# Patient Record
Sex: Male | Born: 1935 | Race: White | Hispanic: No | Marital: Single | Smoking: Former smoker
Health system: Southern US, Community
[De-identification: ages and names within clinical notes are randomized; demographics above are authoritative.]

## PROBLEM LIST (undated history)

## (undated) DIAGNOSIS — F32A Depression, unspecified: Secondary | ICD-10-CM

## (undated) DIAGNOSIS — F329 Major depressive disorder, single episode, unspecified: Secondary | ICD-10-CM

## (undated) DIAGNOSIS — J45909 Unspecified asthma, uncomplicated: Secondary | ICD-10-CM

## (undated) DIAGNOSIS — F319 Bipolar disorder, unspecified: Secondary | ICD-10-CM

## (undated) DIAGNOSIS — F0390 Unspecified dementia without behavioral disturbance: Secondary | ICD-10-CM

## (undated) DIAGNOSIS — F209 Schizophrenia, unspecified: Secondary | ICD-10-CM

## (undated) DIAGNOSIS — E785 Hyperlipidemia, unspecified: Secondary | ICD-10-CM

## (undated) DIAGNOSIS — J449 Chronic obstructive pulmonary disease, unspecified: Secondary | ICD-10-CM

## (undated) DIAGNOSIS — F410 Panic disorder [episodic paroxysmal anxiety] without agoraphobia: Secondary | ICD-10-CM

## (undated) DIAGNOSIS — K589 Irritable bowel syndrome without diarrhea: Secondary | ICD-10-CM

## (undated) DIAGNOSIS — C3490 Malignant neoplasm of unspecified part of unspecified bronchus or lung: Secondary | ICD-10-CM

## (undated) HISTORY — PX: APPENDECTOMY: SHX54

---

## 2007-02-09 ENCOUNTER — Inpatient Hospital Stay: Payer: Self-pay | Admitting: Psychiatry

## 2007-06-11 ENCOUNTER — Emergency Department: Payer: Self-pay | Admitting: Emergency Medicine

## 2007-06-11 ENCOUNTER — Other Ambulatory Visit: Payer: Self-pay

## 2007-08-03 ENCOUNTER — Other Ambulatory Visit: Payer: Self-pay

## 2007-08-03 ENCOUNTER — Inpatient Hospital Stay: Payer: Self-pay | Admitting: Internal Medicine

## 2007-08-04 ENCOUNTER — Other Ambulatory Visit: Payer: Self-pay

## 2007-08-27 ENCOUNTER — Ambulatory Visit: Payer: Self-pay | Admitting: General Surgery

## 2007-09-02 ENCOUNTER — Inpatient Hospital Stay: Payer: Self-pay | Admitting: General Surgery

## 2009-07-28 IMAGING — CT CT GUIDED ABCESS DRAINAGE WITH CATHETER
1 of 3 series · 15 of 32 positions shown, 20 images · non-contrast
Comparison: none

REASON FOR EXAM: percutaneous cholecystostomy
COMMENTS:

PROCEDURE:     CT  - CT GUIDED ABSCESS DRAINAGE  - August 06, 2007 [DATE]
RESULT:
HISTORY: Cholecystitis.

[Series 2: soft tissue · axial · 0.92mm/px · z∈[-1279,-1015]mm · 15 of 37 slices shown, 20 images]
[im 2/37  soft-tissue]
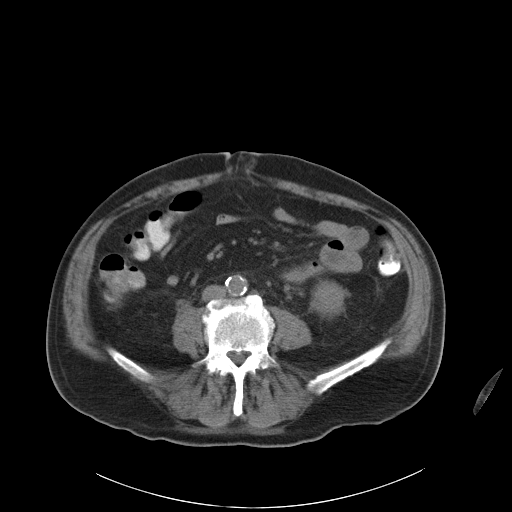
[im 2/37  bone]
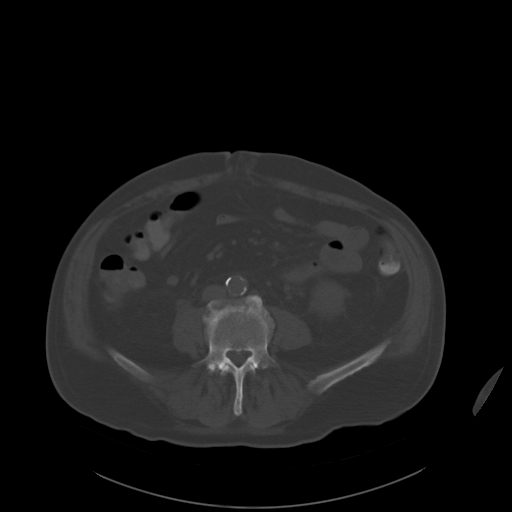
[im 4/37  soft-tissue]
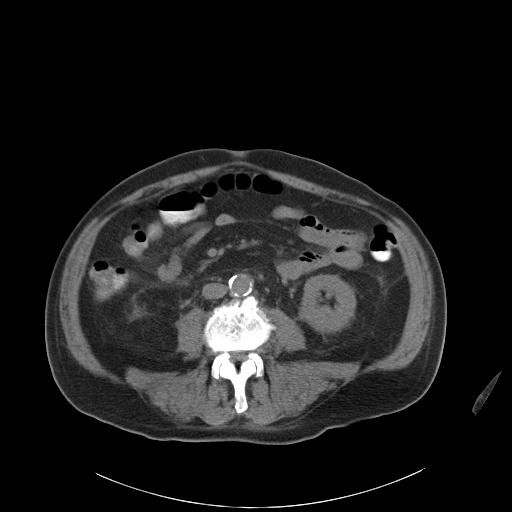
[im 8/37  soft-tissue]
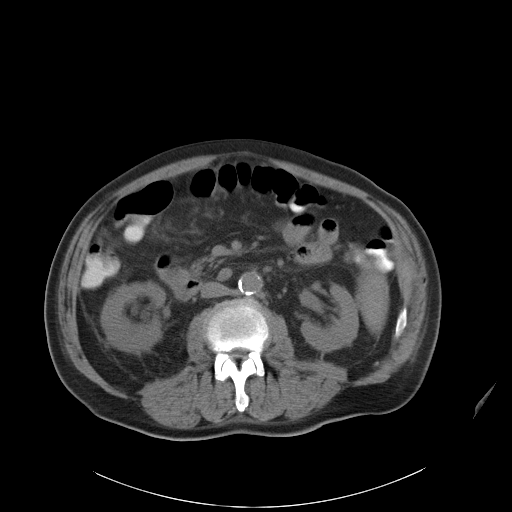
[im 10/37  soft-tissue]
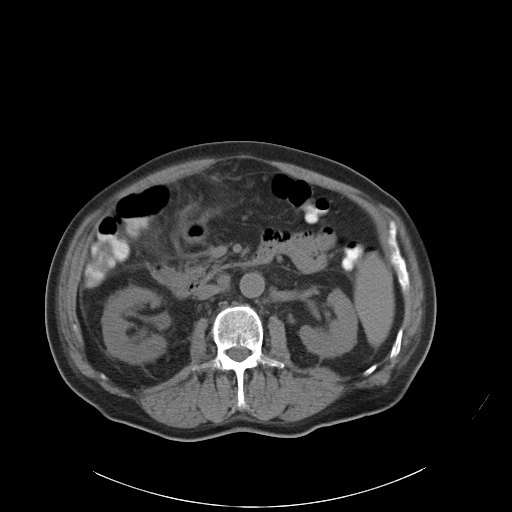
[im 12/37  soft-tissue]
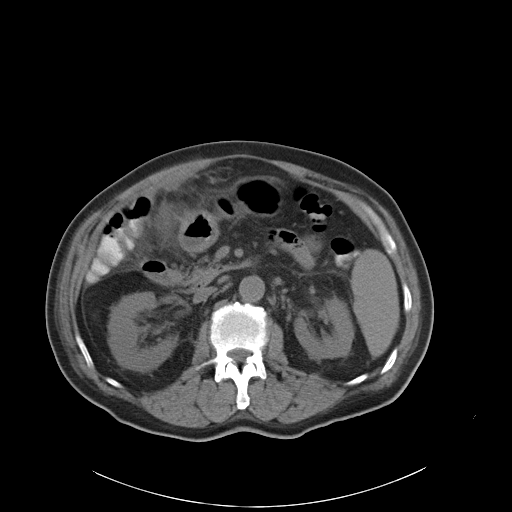
[im 16/37  soft-tissue]
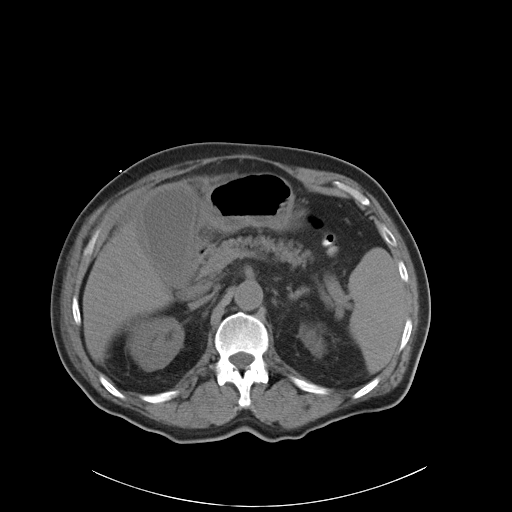
[im 18/37  soft-tissue]
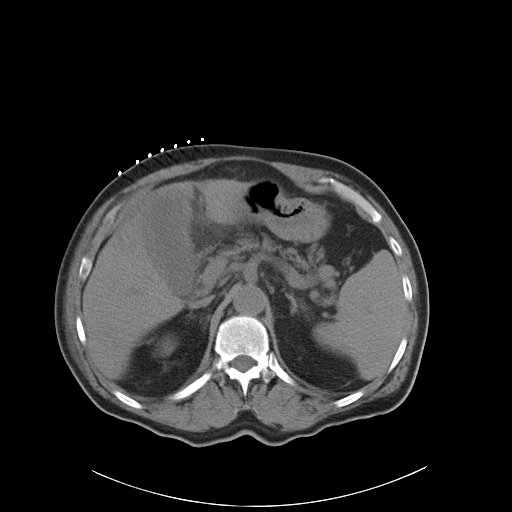
[im 19/37  soft-tissue]
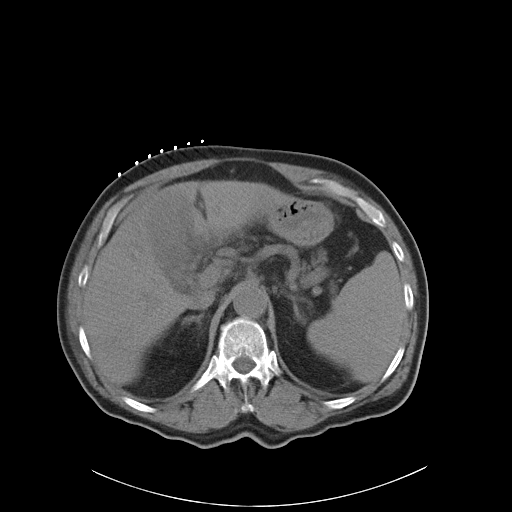
[im 21/37  soft-tissue]
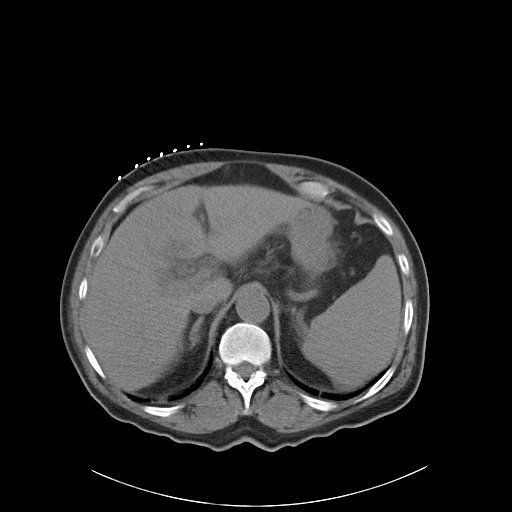
[im 21/37  bone]
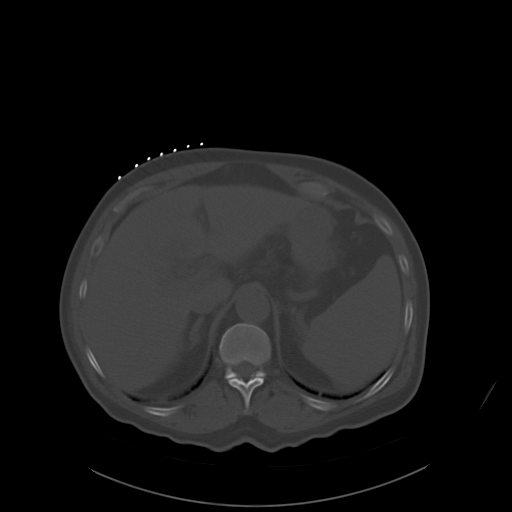
[im 25/37  soft-tissue]
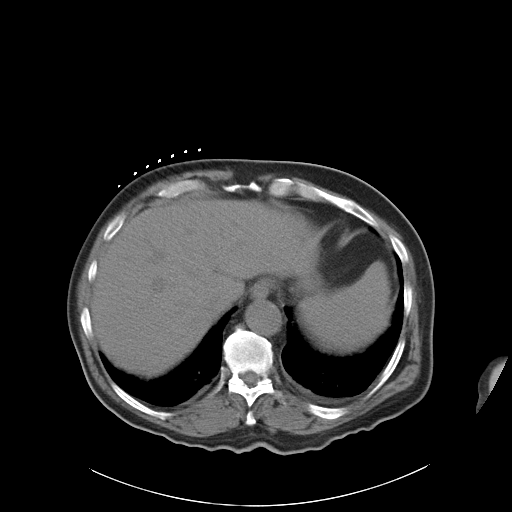
[im 27/37  soft-tissue]
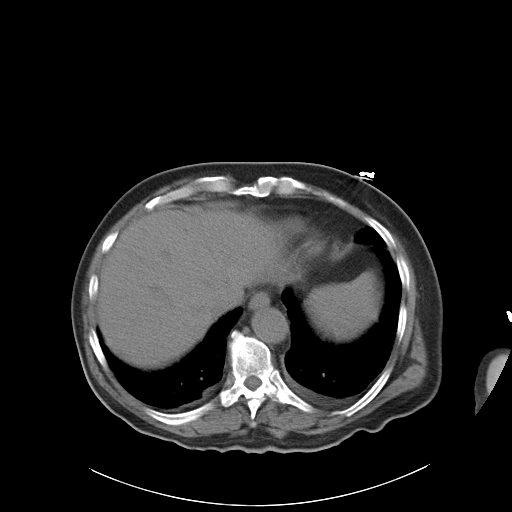
[im 29/37  soft-tissue]
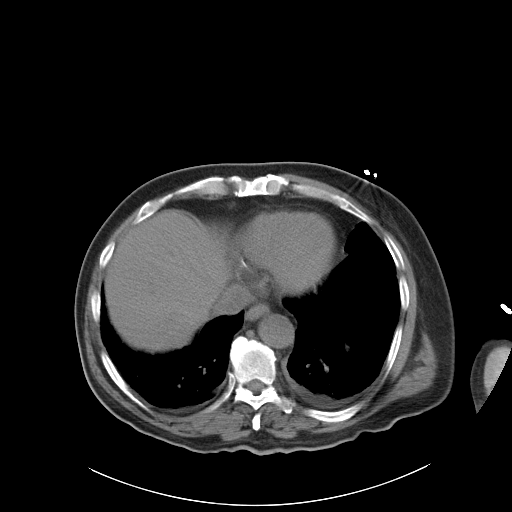
[im 29/37  lung]
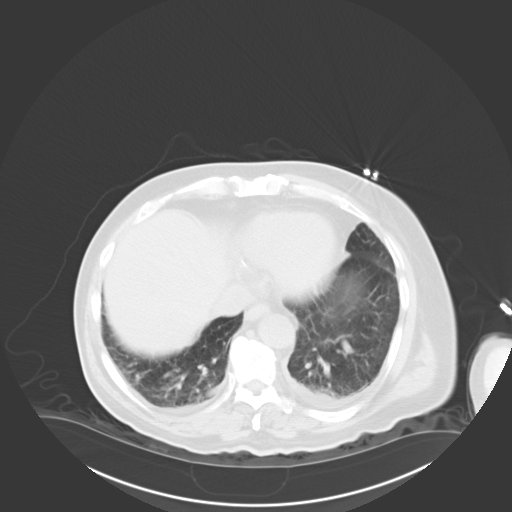
[im 31/37  lung]
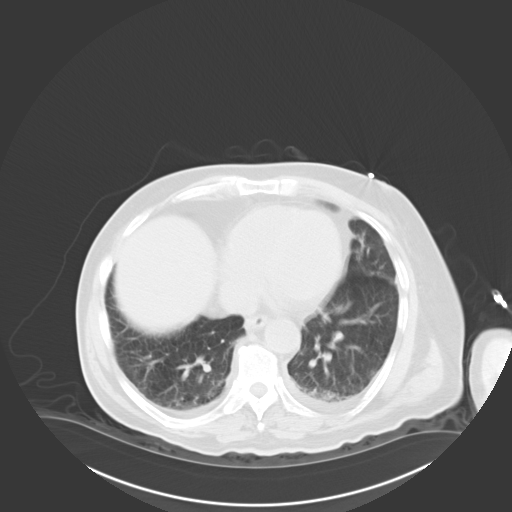
[im 33/37  soft-tissue]
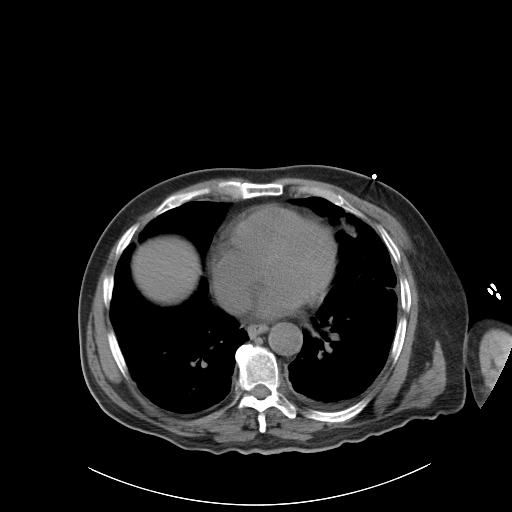
[im 33/37  lung]
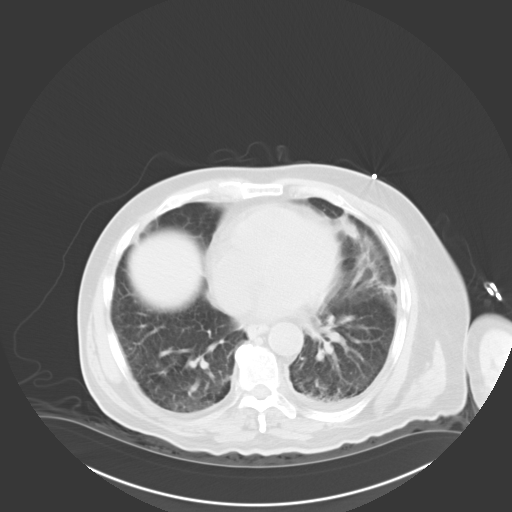
[im 35/37  soft-tissue]
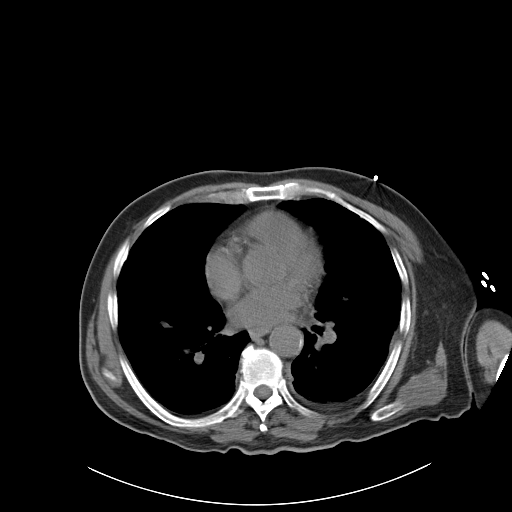
[im 35/37  lung]
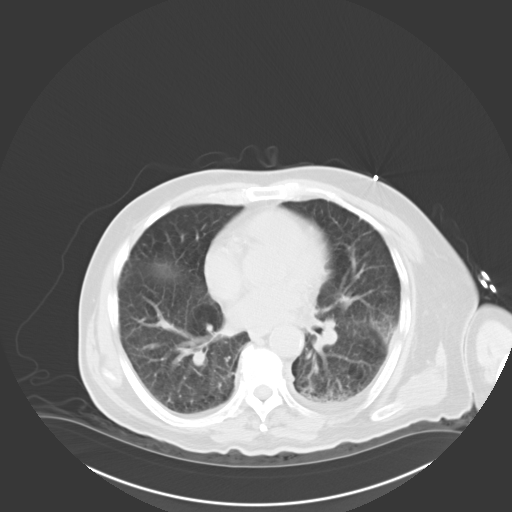

[15 of 32 positions shown; findings below may reference images not displayed]

PROCEDURE AND FINDINGS:  Standard CT fluoroscopy was performed to locate the
gallbladder.  The patient was sterilely prepped and draped and local
anesthesia was administered with 1% lidocaine. This was followed by
placement of a 6 French trochar catheter into the gallbladder and drainage
of the gallbladder without complication.  The catheter was left to gravity
drainage.
IMPRESSION: Successful catheter drainage of percutaneous cholecystomy.

## 2009-07-28 IMAGING — CR DG ABDOMEN 1V
1 series · 2 of 2 positions shown · non-contrast
Comparison: none

REASON FOR EXAM: ileus
COMMENTS:

[Series 1: view not recorded · 0.17mm/px · 2 of 2 slices shown]
[im 1/2]
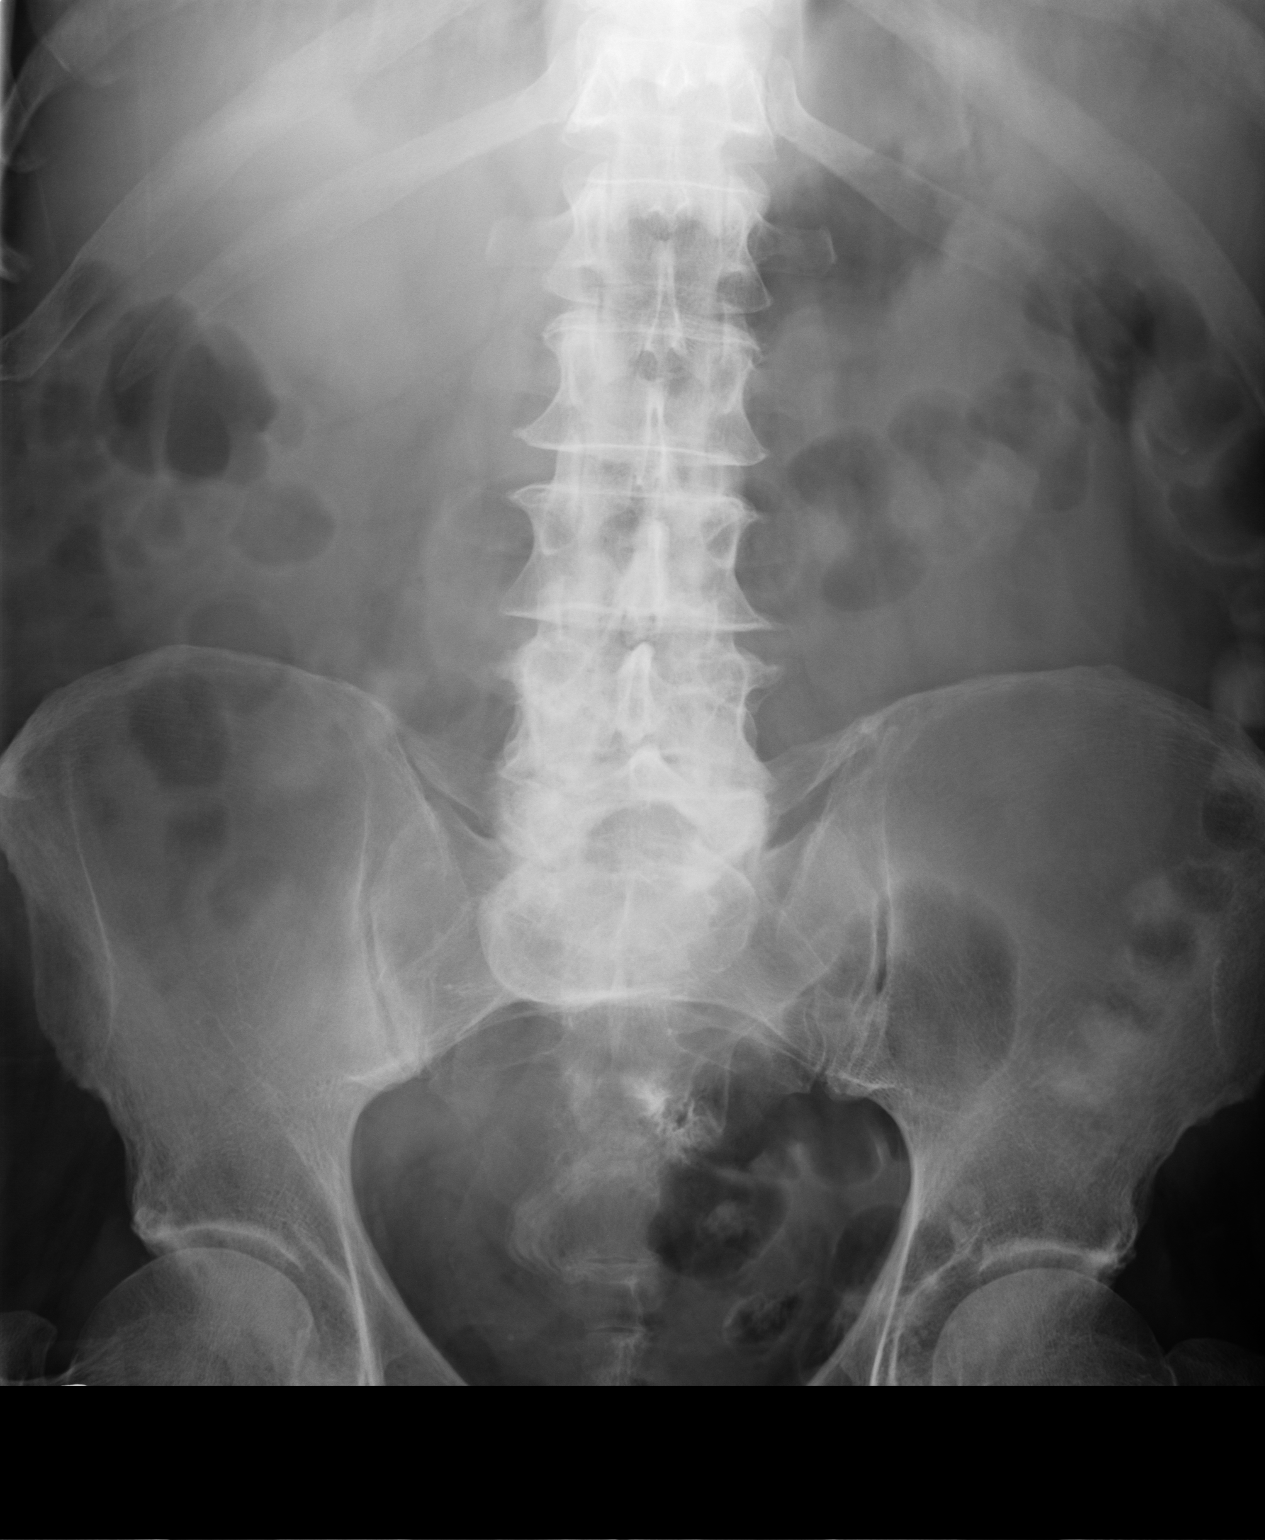
[im 2/2]
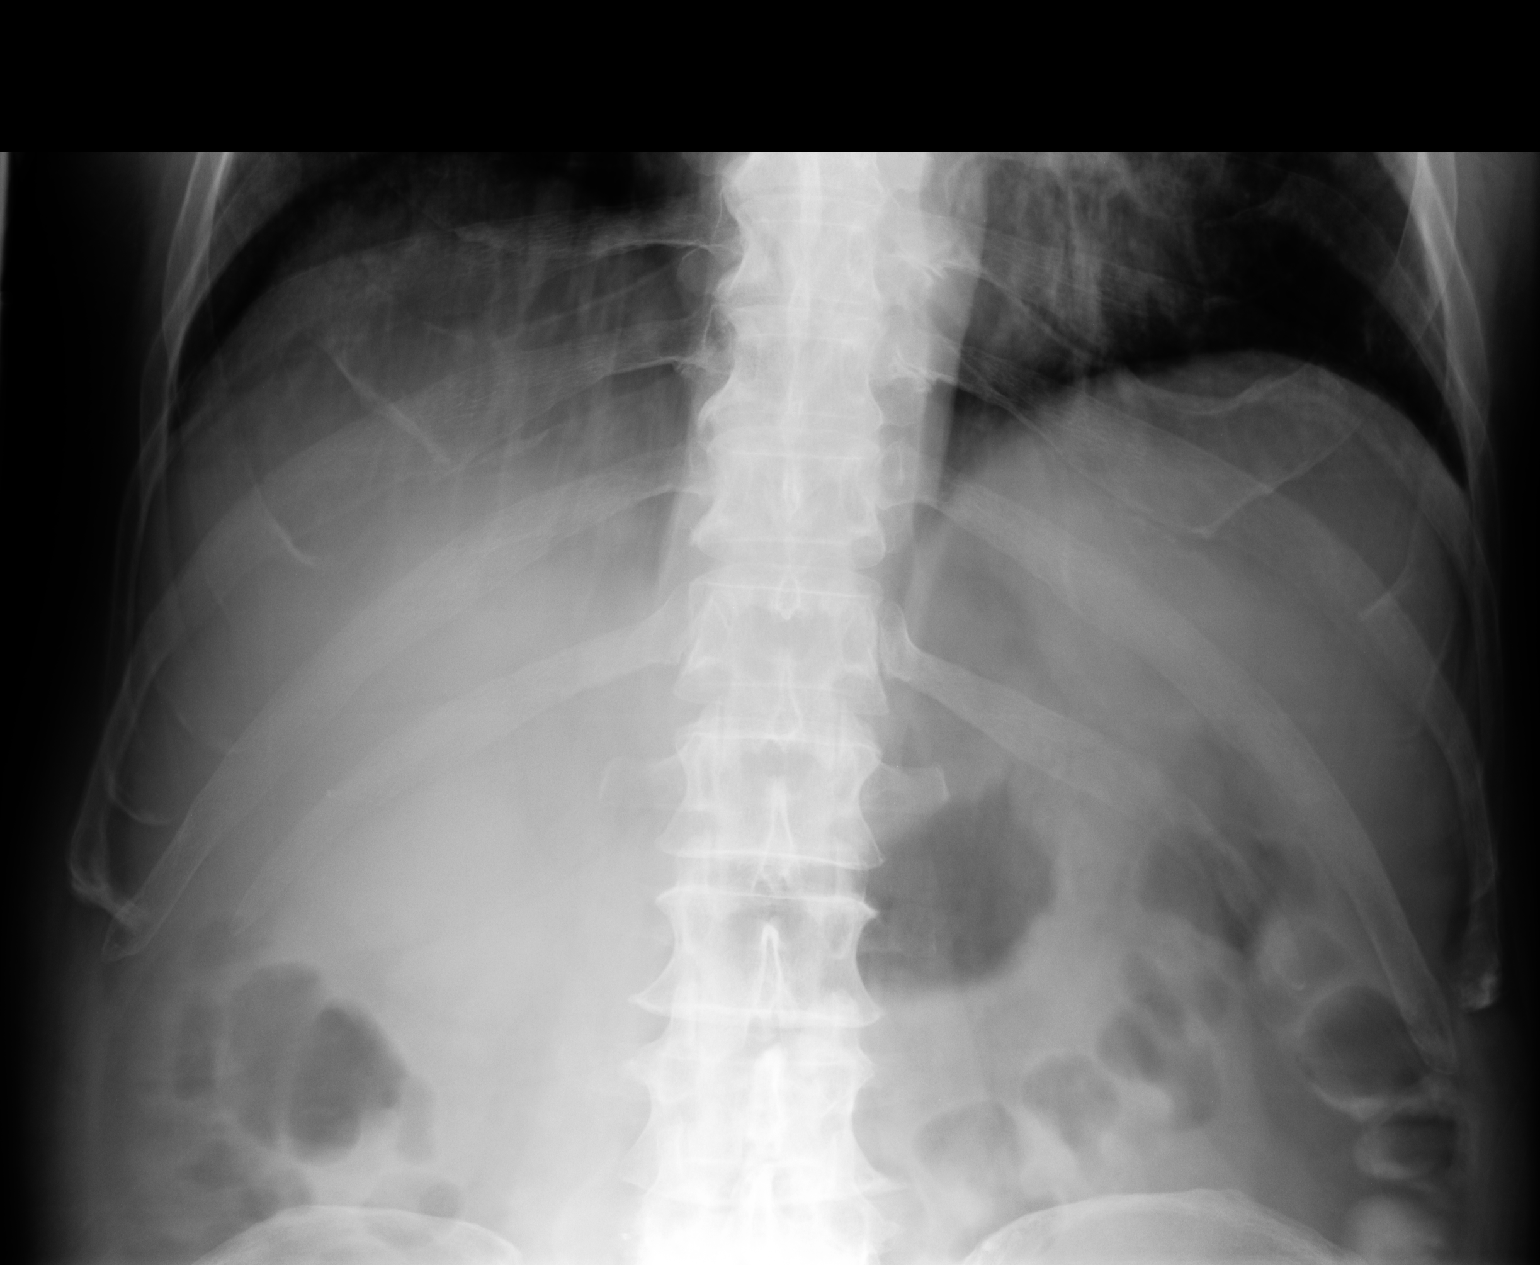

[2 of 2 positions shown; findings below may reference images not displayed]

PROCEDURE:     DXR - DXR KIDNEY URETER BLADDER  - August 06, 2007  [DATE]

RESULT:     The bowel gas pattern is relatively nonspecific. I do not see
evidence of obstruction. The bony structures are normal in appearance. No
abnormal soft tissue calcifications are demonstrated. There is some motion
artifact present.
IMPRESSION: I do not see findings to suggest ileus or bowel obstruction.

## 2015-01-14 ENCOUNTER — Emergency Department: Payer: Medicare Other

## 2015-01-14 ENCOUNTER — Encounter: Payer: Self-pay | Admitting: *Deleted

## 2015-01-14 ENCOUNTER — Inpatient Hospital Stay
Admission: EM | Admit: 2015-01-14 | Discharge: 2015-01-27 | DRG: 181 | Disposition: A | Discharge status: Hospice | Payer: Medicare Other | Attending: Internal Medicine | Admitting: Internal Medicine

## 2015-01-14 DIAGNOSIS — E46 Unspecified protein-calorie malnutrition: Secondary | ICD-10-CM | POA: Diagnosis present

## 2015-01-14 DIAGNOSIS — Z9049 Acquired absence of other specified parts of digestive tract: Secondary | ICD-10-CM | POA: Diagnosis present

## 2015-01-14 DIAGNOSIS — R079 Chest pain, unspecified: Secondary | ICD-10-CM

## 2015-01-14 DIAGNOSIS — R630 Anorexia: Secondary | ICD-10-CM

## 2015-01-14 DIAGNOSIS — R627 Adult failure to thrive: Secondary | ICD-10-CM | POA: Diagnosis not present

## 2015-01-14 DIAGNOSIS — C3490 Malignant neoplasm of unspecified part of unspecified bronchus or lung: Secondary | ICD-10-CM

## 2015-01-14 DIAGNOSIS — Z85118 Personal history of other malignant neoplasm of bronchus and lung: Secondary | ICD-10-CM

## 2015-01-14 DIAGNOSIS — Z515 Encounter for palliative care: Secondary | ICD-10-CM

## 2015-01-14 DIAGNOSIS — F3162 Bipolar disorder, current episode mixed, moderate: Secondary | ICD-10-CM | POA: Insufficient documentation

## 2015-01-14 DIAGNOSIS — K589 Irritable bowel syndrome without diarrhea: Secondary | ICD-10-CM | POA: Diagnosis present

## 2015-01-14 DIAGNOSIS — D638 Anemia in other chronic diseases classified elsewhere: Secondary | ICD-10-CM | POA: Diagnosis present

## 2015-01-14 DIAGNOSIS — Z9981 Dependence on supplemental oxygen: Secondary | ICD-10-CM | POA: Diagnosis not present

## 2015-01-14 DIAGNOSIS — F0390 Unspecified dementia without behavioral disturbance: Secondary | ICD-10-CM | POA: Diagnosis not present

## 2015-01-14 DIAGNOSIS — F319 Bipolar disorder, unspecified: Secondary | ICD-10-CM | POA: Diagnosis present

## 2015-01-14 DIAGNOSIS — C7989 Secondary malignant neoplasm of other specified sites: Secondary | ICD-10-CM | POA: Diagnosis present

## 2015-01-14 DIAGNOSIS — E785 Hyperlipidemia, unspecified: Secondary | ICD-10-CM | POA: Diagnosis not present

## 2015-01-14 DIAGNOSIS — J45909 Unspecified asthma, uncomplicated: Secondary | ICD-10-CM | POA: Diagnosis present

## 2015-01-14 DIAGNOSIS — R222 Localized swelling, mass and lump, trunk: Secondary | ICD-10-CM | POA: Diagnosis not present

## 2015-01-14 DIAGNOSIS — J449 Chronic obstructive pulmonary disease, unspecified: Secondary | ICD-10-CM | POA: Diagnosis present

## 2015-01-14 DIAGNOSIS — F32A Depression, unspecified: Secondary | ICD-10-CM | POA: Diagnosis present

## 2015-01-14 DIAGNOSIS — F418 Other specified anxiety disorders: Secondary | ICD-10-CM | POA: Diagnosis not present

## 2015-01-14 DIAGNOSIS — F329 Major depressive disorder, single episode, unspecified: Secondary | ICD-10-CM | POA: Diagnosis present

## 2015-01-14 DIAGNOSIS — Z66 Do not resuscitate: Secondary | ICD-10-CM | POA: Diagnosis not present

## 2015-01-14 DIAGNOSIS — E860 Dehydration: Secondary | ICD-10-CM | POA: Diagnosis present

## 2015-01-14 DIAGNOSIS — C3491 Malignant neoplasm of unspecified part of right bronchus or lung: Principal | ICD-10-CM | POA: Diagnosis present

## 2015-01-14 DIAGNOSIS — F209 Schizophrenia, unspecified: Secondary | ICD-10-CM | POA: Diagnosis not present

## 2015-01-14 DIAGNOSIS — R569 Unspecified convulsions: Secondary | ICD-10-CM | POA: Diagnosis present

## 2015-01-14 DIAGNOSIS — R634 Abnormal weight loss: Secondary | ICD-10-CM | POA: Diagnosis not present

## 2015-01-14 DIAGNOSIS — F410 Panic disorder [episodic paroxysmal anxiety] without agoraphobia: Secondary | ICD-10-CM | POA: Diagnosis present

## 2015-01-14 DIAGNOSIS — R109 Unspecified abdominal pain: Secondary | ICD-10-CM

## 2015-01-14 DIAGNOSIS — J189 Pneumonia, unspecified organism: Secondary | ICD-10-CM

## 2015-01-14 DIAGNOSIS — Z87891 Personal history of nicotine dependence: Secondary | ICD-10-CM

## 2015-01-14 DIAGNOSIS — E440 Moderate protein-calorie malnutrition: Secondary | ICD-10-CM | POA: Insufficient documentation

## 2015-01-14 DIAGNOSIS — Z681 Body mass index (BMI) 19 or less, adult: Secondary | ICD-10-CM | POA: Diagnosis not present

## 2015-01-14 DIAGNOSIS — Z79899 Other long term (current) drug therapy: Secondary | ICD-10-CM | POA: Diagnosis not present

## 2015-01-14 HISTORY — DX: Depression, unspecified: F32.A

## 2015-01-14 HISTORY — DX: Unspecified asthma, uncomplicated: J45.909

## 2015-01-14 HISTORY — DX: Panic disorder (episodic paroxysmal anxiety): F41.0

## 2015-01-14 HISTORY — DX: Hyperlipidemia, unspecified: E78.5

## 2015-01-14 HISTORY — DX: Bipolar disorder, unspecified: F31.9

## 2015-01-14 HISTORY — DX: Malignant neoplasm of unspecified part of unspecified bronchus or lung: C34.90

## 2015-01-14 HISTORY — DX: Schizophrenia, unspecified: F20.9

## 2015-01-14 HISTORY — DX: Chronic obstructive pulmonary disease, unspecified: J44.9

## 2015-01-14 HISTORY — DX: Major depressive disorder, single episode, unspecified: F32.9

## 2015-01-14 HISTORY — DX: Unspecified dementia, unspecified severity, without behavioral disturbance, psychotic disturbance, mood disturbance, and anxiety: F03.90

## 2015-01-14 HISTORY — DX: Irritable bowel syndrome, unspecified: K58.9

## 2015-01-14 LAB — URINALYSIS COMPLETE WITH MICROSCOPIC (ARMC ONLY)
Bilirubin Urine: NEGATIVE
GLUCOSE, UA: NEGATIVE mg/dL
Hgb urine dipstick: NEGATIVE
KETONES UR: NEGATIVE mg/dL
LEUKOCYTES UA: NEGATIVE
Nitrite: NEGATIVE
PROTEIN: NEGATIVE mg/dL
SPECIFIC GRAVITY, URINE: 1.021 (ref 1.005–1.030)
pH: 5 (ref 5.0–8.0)

## 2015-01-14 LAB — CBC WITH DIFFERENTIAL/PLATELET
BASOS PCT: 1 %
Basophils Absolute: 0.1 10*3/uL (ref 0–0.1)
EOS ABS: 0.1 10*3/uL (ref 0–0.7)
EOS PCT: 1 %
HEMATOCRIT: 29.8 % — AB (ref 40.0–52.0)
HEMOGLOBIN: 9.7 g/dL — AB (ref 13.0–18.0)
LYMPHS ABS: 0.8 10*3/uL — AB (ref 1.0–3.6)
Lymphocytes Relative: 8 %
MCH: 27.7 pg (ref 26.0–34.0)
MCHC: 32.7 g/dL (ref 32.0–36.0)
MCV: 84.8 fL (ref 80.0–100.0)
MONOS PCT: 11 %
Monocytes Absolute: 1 10*3/uL (ref 0.2–1.0)
Neutro Abs: 7.6 10*3/uL — ABNORMAL HIGH (ref 1.4–6.5)
Neutrophils Relative %: 79 %
Platelets: 353 10*3/uL (ref 150–440)
RBC: 3.51 MIL/uL — ABNORMAL LOW (ref 4.40–5.90)
RDW: 13.9 % (ref 11.5–14.5)
WBC: 9.6 10*3/uL (ref 3.8–10.6)

## 2015-01-14 LAB — COMPREHENSIVE METABOLIC PANEL
ALBUMIN: 2.2 g/dL — AB (ref 3.5–5.0)
ALK PHOS: 199 U/L — AB (ref 38–126)
ALT: 46 U/L (ref 17–63)
AST: 40 U/L (ref 15–41)
Anion gap: 8 (ref 5–15)
BUN: 24 mg/dL — ABNORMAL HIGH (ref 6–20)
CO2: 28 mmol/L (ref 22–32)
Calcium: 8.6 mg/dL — ABNORMAL LOW (ref 8.9–10.3)
Chloride: 100 mmol/L — ABNORMAL LOW (ref 101–111)
Creatinine, Ser: 1.04 mg/dL (ref 0.61–1.24)
GFR calc Af Amer: >60 mL/min (ref >60)
GFR calc non Af Amer: >60 mL/min (ref >60)
Glucose, Bld: 137 mg/dL — ABNORMAL HIGH (ref 65–99)
POTASSIUM: 4.6 mmol/L (ref 3.5–5.1)
Sodium: 136 mmol/L (ref 135–145)
TOTAL PROTEIN: 6.9 g/dL (ref 6.5–8.1)
Total Bilirubin: 1.7 mg/dL — ABNORMAL HIGH (ref 0.3–1.2)

## 2015-01-14 LAB — URINE DRUG SCREEN, QUALITATIVE (ARMC ONLY)
AMPHETAMINES, UR SCREEN: NOT DETECTED
Barbiturates, Ur Screen: NOT DETECTED
Benzodiazepine, Ur Scrn: POSITIVE — AB
Cannabinoid 50 Ng, Ur ~~LOC~~: NOT DETECTED
Cocaine Metabolite,Ur ~~LOC~~: NOT DETECTED
MDMA (Ecstasy)Ur Screen: NOT DETECTED
METHADONE SCREEN, URINE: NOT DETECTED
Opiate, Ur Screen: NOT DETECTED
PHENCYCLIDINE (PCP) UR S: NOT DETECTED
Tricyclic, Ur Screen: POSITIVE — AB

## 2015-01-14 LAB — LACTIC ACID, PLASMA: Lactic Acid, Venous: 1.2 mmol/L (ref 0.5–2.0)

## 2015-01-14 LAB — TROPONIN I

## 2015-01-14 LAB — ETHANOL

## 2015-01-14 LAB — ACETAMINOPHEN LEVEL

## 2015-01-14 MED ORDER — SENNOSIDES-DOCUSATE SODIUM 8.6-50 MG PO TABS: 1.0000 | ORAL_TABLET | Freq: Every evening | ORAL | Status: DC | PRN

## 2015-01-14 MED ORDER — IOHEXOL 300 MG/ML  SOLN
100.0000 mL | Freq: Once | INTRAMUSCULAR | Status: AC | PRN
  Administered 2015-01-14: 100 mL via INTRAVENOUS

## 2015-01-14 MED ORDER — SODIUM CHLORIDE 0.9 % IV SOLN
INTRAVENOUS | Status: DC
  Administered 2015-01-15: via INTRAVENOUS

## 2015-01-14 MED ORDER — ENOXAPARIN SODIUM 40 MG/0.4ML ~~LOC~~ SOLN
40.0000 mg | SUBCUTANEOUS | Status: DC
  Administered 2015-01-16 – 2015-01-27 (×12): 40 mg via SUBCUTANEOUS
  Filled 2015-01-14 (×13): qty 0.4

## 2015-01-14 MED ORDER — ACETAMINOPHEN 650 MG RE SUPP: 650.0000 mg | Freq: Four times a day (QID) | RECTAL | Status: DC | PRN

## 2015-01-14 MED ORDER — ACETAMINOPHEN 325 MG PO TABS
650.0000 mg | ORAL_TABLET | Freq: Four times a day (QID) | ORAL | Status: DC | PRN
  Administered 2015-01-19 – 2015-01-25 (×3): 650 mg via ORAL
  Filled 2015-01-14 (×4): qty 2

## 2015-01-14 NOTE — ED Notes (Signed)
Pt brought in via ems with ivc papers.  Pt has not been taking care of himself, no water in the home, no change of clothes in days.  Pt appears unkept, no shaving or bathing.  Pt denies SI or HI.   Pt calm at this time.  Iv infusing at this time.  md at bedside on arrival to room.

## 2015-01-14 NOTE — ED Notes (Signed)
Doran Clay) phone [phone] .

## 2015-01-14 NOTE — ED Notes (Signed)
Margaret RN beh med nurse in with pt.

## 2015-01-14 NOTE — ED Notes (Signed)

## 2015-01-14 NOTE — H&P (Signed)
[place] [facility] [place] NAME: Vincent Andrade    MR#:  [id number]  DATE OF BIRTH:  [birth year]  DATE OF ADMISSION:  01/14/2015  PRIMARY CARE PHYSICIAN: No primary care provider on file.   REQUESTING/REFERRING PHYSICIAN: Malinda  CHIEF COMPLAINT:   Chief Complaint  Patient presents with  . Failure To Thrive    HISTORY OF PRESENT ILLNESS:  Vincent Andrade  is a 79 y.o. male who presents with failure to thrive. The patient is not completely oriented on interview with this Probation officer. He is on insistent on wanting to leave. Reportedly he was brought by EMS, unclear who called them, and IVC as he was a self safety risk concern. EMS report stated that he has not been taking his medications, has not been bathing, has not been eating well. Evaluation in the ED included CT chest which showed an apical mass with erosion in the chest wall consistent with bronchogenic carcinoma. Hospitalist were then called for admission for workup of the mass which is likely cancer. Patient states that he has had some intermittent fevers lately. He also has a chronic cough, but denies hemoptysis. Given his emotional and mental state, he is an unreliable historian. He is very tearful on interview, and cannot identify exactly where he is on questioning. See full exam and review of systems below.  PAST MEDICAL HISTORY:   Past Medical History  Diagnosis Date  . Asthma   . Bipolar disorder   . Depression   . Dementia   . HLD (hyperlipidemia)   . IBS (irritable bowel syndrome)   . Schizophrenia   . COPD (chronic obstructive pulmonary disease)   . Panic attacks   . Lung cancer 01/14/2015    PAST SURGICAL HISTORY:   Past Surgical History  Procedure Laterality Date  . Appendectomy      SOCIAL HISTORY:   History  Substance Use Topics  . Smoking status: Former Research scientist (life sciences)  . Smokeless tobacco: Not on file  . Alcohol Use: No    FAMILY HISTORY:   Family  History  Problem Relation Age of Onset  . Bipolar disorder    . Cancer      grandmother    DRUG ALLERGIES:  No Known Allergies  MEDICATIONS AT HOME:   Prior to Admission medications   Not on File    REVIEW OF SYSTEMS:  Review of Systems  Constitutional: Positive for fever (intermittent as reported by patient.) and weight loss. Negative for chills and malaise/fatigue.  HENT: Negative for ear pain, hearing loss and tinnitus.   Eyes: Negative for blurred vision, double vision, pain and redness.  Respiratory: Positive for cough and wheezing (Occasional). Negative for hemoptysis and shortness of breath.   Cardiovascular: Negative for chest pain, palpitations, orthopnea and leg swelling.  Gastrointestinal: Negative for nausea, vomiting, abdominal pain, diarrhea and constipation.  Genitourinary: Negative for dysuria, frequency and hematuria.  Skin:       No acne, rash, or lesions  Neurological: Positive for weakness. Negative for dizziness, tremors and focal weakness.  Endo/Heme/Allergies: Negative for polydipsia. Does not bruise/bleed easily.  Psychiatric/Behavioral: Positive for depression. The patient is not nervous/anxious and does not have insomnia.      VITAL SIGNS:   Filed Vitals:   01/14/15 1826 01/14/15 2047  BP: 107/77 99/72  Pulse: 87 85  Temp: 98.8 F (37.1 C) 98.4 F (36.9 C)  TempSrc: Oral   Resp: 17 18  Height: '6\' 1"'$  (1.854 m)   Weight:  81.647 kg (180 lb)   SpO2: 96% 95%   Wt Readings from Last 3 Encounters:  01/14/15 81.647 kg (180 lb)    PHYSICAL EXAMINATION:  Physical Exam  Constitutional: He appears well-developed. No distress.  Cachectic  HENT:  Head: Normocephalic and atraumatic.  Mouth/Throat: Oropharynx is clear and moist.  Eyes: Conjunctivae and EOM are normal. Pupils are equal, round, and reactive to light. No scleral icterus.  Neck: Normal range of motion. Neck supple. No JVD present. No thyromegaly present.  Cardiovascular: Normal rate,  regular rhythm and intact distal pulses.  Exam reveals no gallop and no friction rub.   No murmur heard. Respiratory: Effort normal and breath sounds normal. No respiratory distress. He has no wheezes. He has no rales.  Right external chest wall cyst without tenderness or surrounding erythema or signs of infection.  GI: Soft. Bowel sounds are normal. He exhibits no distension. There is no tenderness.  Musculoskeletal: Normal range of motion. He exhibits no edema.  No arthritis, no gout  Lymphadenopathy:    He has no cervical adenopathy.  Neurological: He is alert. No cranial nerve deficit.  Cooperative, not completely oriented, no dysarthria, no aphasia  Skin: Skin is warm and dry. No rash noted. No erythema.  Chest wall cyst as stated above.  Psychiatric:  Tearful, mildly emotionally distressed, not completely oriented as above, poor insight and judgment.    LABORATORY PANEL:   CBC  Recent Labs Lab 01/14/15 1837  WBC 9.6  HGB 9.7*  HCT 29.8*  PLT 353   ------------------------------------------------------------------------------------------------------------------  Chemistries   Recent Labs Lab 01/14/15 1837  NA 136  K 4.6  CL 100*  CO2 28  GLUCOSE 137*  BUN 24*  CREATININE 1.04  CALCIUM 8.6*  AST 40  ALT 46  ALKPHOS 199*  BILITOT 1.7*   ------------------------------------------------------------------------------------------------------------------  Cardiac Enzymes  Recent Labs Lab 01/14/15 1837  TROPONINI <0.03   ------------------------------------------------------------------------------------------------------------------  RADIOLOGY:  Ct Head Wo Contrast  01/14/2015   CLINICAL DATA:  Weight loss with loss of appetite. No history of malignancy. Initial encounter.  EXAM: CT HEAD WITHOUT CONTRAST  TECHNIQUE: Contiguous axial images were obtained from the base of the skull through the vertex without intravenous contrast.  COMPARISON:  None.   FINDINGS: There is no evidence of acute intracranial hemorrhage, mass lesion, brain edema or extra-axial fluid collection. The ventricles and subarachnoid spaces are diffusely prominent consistent with mild atrophy for age. There is no evidence of cortical stroke. Mild intracranial vascular calcifications are noted.  The visualized paranasal sinuses, mastoid air cells and middle ears are clear. The calvarium is intact.  IMPRESSION: Mild generalized atrophy.  No acute intracranial findings.   Electronically Signed   By: Richardean Sale M.D.   On: 01/14/2015 20:30   Ct Chest W Contrast  01/14/2015   CLINICAL DATA:  Altered mental status with weight loss and loss of appetite. No history of malignancy. Previous cholecystectomy and appendectomy. Initial encounter.  EXAM: CT CHEST, ABDOMEN, AND PELVIS WITH CONTRAST  TECHNIQUE: Multidetector CT imaging of the chest, abdomen and pelvis was performed following the standard protocol during bolus administration of intravenous contrast.  CONTRAST:  172m OMNIPAQUE IOHEXOL 300 MG/ML  SOLN  COMPARISON:  Abdominal pelvic CT 08/06/2007. Chest radiographs 08/15/2007.  FINDINGS: CT CHEST FINDINGS  Mediastinum/Nodes: There are enlarged mediastinal and right hilar lymph nodes. There is a right paratracheal node measuring 18 mm on image 20 and a 13 mm right paratracheal node on image 23. There is a 13  mm right hilar node on image 26. The thyroid gland, trachea and esophagus demonstrate no significant findings. The heart size is normal. There is no pericardial effusion.There is atherosclerosis of the aorta, great vessels and coronary arteries.  Lungs/Pleura: There is no pleural effusion.Advanced emphysematous changes are present. There is a right apical mass with chest wall destruction and surrounding parenchymal opacity. The mass measures up to 5.6 x 3.3 cm transverse on axial image 9. There is erosion and partial destruction of the right third rib posteriorly. No other pulmonary  nodules identified.  Musculoskeletal/Chest wall: As above, right apical mass invades the chest wall and partially destroys the right third rib. No distant osseous metastases identified. 4.2 cm probable sebaceous cyst in the right anterior chest wall is unchanged.  CT ABDOMEN AND PELVIS FINDINGS  Hepatobiliary: The liver is normal in density without focal abnormality. No biliary dilatation status post cholecystectomy.  Pancreas: Unremarkable. No pancreatic ductal dilatation or surrounding inflammatory changes.  Spleen: Normal in size without focal abnormality.  Adrenals/Urinary Tract: Both adrenal glands appear normal.Both kidneys demonstrate mild cortical thinning. There are tiny low-density renal lesions bilaterally, likely cysts. No evidence of enhancing mass or urinary tract calculus. No hydronephrosis. No significant bladder findings.  Stomach/Bowel: No evidence of bowel wall thickening, distention or surrounding inflammatory change.  Vascular/Lymphatic: There are no enlarged abdominal or pelvic lymph nodes. Moderate aortoiliac atherosclerosis.  Reproductive: Unremarkable.  Other: No evidence of abdominal wall mass or hernia.  Musculoskeletal: No acute or significant osseous findings.  IMPRESSION: 1. Right apical lung mass with chest wall invasion, partial destruction of the right third rib and mediastinal adenopathy consistent with bronchogenic carcinoma. 2. Adjacent parenchymal opacities appear partially chronic in correlation with prior radiographs. There may be a component of adjacent inflammation as well. 3. No evidence of metastatic disease within the abdomen or pelvis. 4. Moderate atherosclerosis. 5. Advanced emphysema.   Electronically Signed   By: Richardean Sale M.D.   On: 01/14/2015 20:41   Ct Abdomen Pelvis W Contrast  01/14/2015   CLINICAL DATA:  Altered mental status with weight loss and loss of appetite. No history of malignancy. Previous cholecystectomy and appendectomy. Initial encounter.   EXAM: CT CHEST, ABDOMEN, AND PELVIS WITH CONTRAST  TECHNIQUE: Multidetector CT imaging of the chest, abdomen and pelvis was performed following the standard protocol during bolus administration of intravenous contrast.  CONTRAST:  142m OMNIPAQUE IOHEXOL 300 MG/ML  SOLN  COMPARISON:  Abdominal pelvic CT 08/06/2007. Chest radiographs 08/15/2007.  FINDINGS: CT CHEST FINDINGS  Mediastinum/Nodes: There are enlarged mediastinal and right hilar lymph nodes. There is a right paratracheal node measuring 18 mm on image 20 and a 13 mm right paratracheal node on image 23. There is a 13 mm right hilar node on image 26. The thyroid gland, trachea and esophagus demonstrate no significant findings. The heart size is normal. There is no pericardial effusion.There is atherosclerosis of the aorta, great vessels and coronary arteries.  Lungs/Pleura: There is no pleural effusion.Advanced emphysematous changes are present. There is a right apical mass with chest wall destruction and surrounding parenchymal opacity. The mass measures up to 5.6 x 3.3 cm transverse on axial image 9. There is erosion and partial destruction of the right third rib posteriorly. No other pulmonary nodules identified.  Musculoskeletal/Chest wall: As above, right apical mass invades the chest wall and partially destroys the right third rib. No distant osseous metastases identified. 4.2 cm probable sebaceous cyst in the right anterior chest wall is unchanged.  CT ABDOMEN  AND PELVIS FINDINGS  Hepatobiliary: The liver is normal in density without focal abnormality. No biliary dilatation status post cholecystectomy.  Pancreas: Unremarkable. No pancreatic ductal dilatation or surrounding inflammatory changes.  Spleen: Normal in size without focal abnormality.  Adrenals/Urinary Tract: Both adrenal glands appear normal.Both kidneys demonstrate mild cortical thinning. There are tiny low-density renal lesions bilaterally, likely cysts. No evidence of enhancing mass or  urinary tract calculus. No hydronephrosis. No significant bladder findings.  Stomach/Bowel: No evidence of bowel wall thickening, distention or surrounding inflammatory change.  Vascular/Lymphatic: There are no enlarged abdominal or pelvic lymph nodes. Moderate aortoiliac atherosclerosis.  Reproductive: Unremarkable.  Other: No evidence of abdominal wall mass or hernia.  Musculoskeletal: No acute or significant osseous findings.  IMPRESSION: 1. Right apical lung mass with chest wall invasion, partial destruction of the right third rib and mediastinal adenopathy consistent with bronchogenic carcinoma. 2. Adjacent parenchymal opacities appear partially chronic in correlation with prior radiographs. There may be a component of adjacent inflammation as well. 3. No evidence of metastatic disease within the abdomen or pelvis. 4. Moderate atherosclerosis. 5. Advanced emphysema.   Electronically Signed   By: Richardean Sale M.D.   On: 01/14/2015 20:41    EKG:   Orders placed or performed during the hospital encounter of 01/14/15  . ED EKG  . ED EKG    IMPRESSION AND PLAN:  Active Problems:   Lung cancer - we'll admit to inpatient, and get oncology consult tomorrow for recommendations for workup and treatment.   Failure to thrive in adult - likely due to a combination of his mental and emotional state as well as his active cancer. Will treat both the underlying problems as listed.   Depression - this is a chronic problem, we will get a psychiatry consult has patient has been IVC for his failure to thrive in conjunction with his behavioral health diagnoses.   Bipolar disorder - not currently manic, psychiatry consult as above. On chart review the patient was on lithium at some point in the past, unclear what his current treatment as of this time.   COPD (chronic obstructive pulmonary disease) - chronic problem, verified on imaging today, unclear what medications he is supposed to be actively taking for this,  currently stable. Monitor and use duo nebs as needed.  All the records are reviewed and case discussed with ED provider. Management plans discussed with the patient and/or family.  DVT PROPHYLAXIS: SubQ lovenox  ADMISSION STATUS: Inpatient  CODE STATUS: Full code for now, the patient was stating that he felt like his life had been long enough and he just "wanted to say goodbye to his children". This will likely need to be readdressed either when the patient isn't better emotional and mental state, or with family members.  TOTAL TIME TAKING CARE OF THIS PATIENT: 45 minutes.    Leida Luton Nice 01/14/2015, 10:43 PM  [facility]  Office  [phone]  CC: Primary care physician; No primary care provider on file.

## 2015-01-14 NOTE — ED Provider Notes (Signed)
[facility] Emergency Department Provider Note  ____________________________________________  Time seen: Approximately 9:34 PM  I have reviewed the triage vital signs and the nursing notes.   HISTORY  Chief Complaint Failure To Thrive    HPI Vincent Andrade is a 79 y.o. male vision lives at home he was sent here by the social worker under commitment with a history of previously being diagnosed bipolar illness and depression he is under weight and not eating and not seeking medical care short reports is no water in the kitchen and the respondent has not changed his clothes and many days was sent here under commitment because the social worker felt the patient's condition would deteriorate patient himself reports that he is not eating and is not hungry he smells like he has not bathed in several days. Patient denies fever vomiting abdominal pain cough just says he does not feel like eating and is not hungry   Past Medical History  Diagnosis Date  . Asthma     There are no active problems to display for this patient.   No past surgical history on file.  No current outpatient prescriptions on file.  Allergies Review of patient's allergies indicates no known allergies.  No family history on file.  Social History History  Substance Use Topics  . Smoking status: Former Research scientist (life sciences)  . Smokeless tobacco: Not on file  . Alcohol Use: No    Review of Systems Constitutional: No fever/chills Eyes: No visual changes. ENT: No sore throat. Cardiovascular: Denies chest pain. Respiratory: Denies shortness of breath. Gastrointestinal: No abdominal pain.  No nausea, no vomiting.  No diarrhea.  No constipation. Genitourinary: Negative for dysuria. Musculoskeletal: Negative for back pain. Skin: Negative for rash. Neurological: Negative for headaches, focal weakness or numbness. Endocrine:  10-point ROS otherwise  negative.  ____________________________________________   PHYSICAL EXAM:  VITAL SIGNS: ED Triage Vitals  Enc Vitals Group     BP 01/14/15 1826 107/77 mmHg     Pulse Rate 01/14/15 1826 87     Resp 01/14/15 1826 17     Temp 01/14/15 1826 98.8 F (37.1 C)     Temp Source 01/14/15 1826 Oral     SpO2 01/14/15 1826 96 %     Weight 01/14/15 1826 180 lb (81.647 kg)     Height 01/14/15 1826 '6\' 1"'$  (1.854 m)     Head Cir --      Peak Flow --      Pain Score --      Pain Loc --      Pain Edu? --      Excl. in [place]? --     Constitutional: Patient is alert and oriented however thin and in fact cachectic looks very pale and smells associated lucky has had a bath or shower in several days Eyes: Conjunctivae are normal. PERRL. EOMI. sclera are pale Head: Atraumatic. Nose: No congestion/rhinnorhea. Mouth/Throat: Mucous membranes are moist.  Oropharynx non-erythematous. Neck: No stridor. Cardiovascular: Normal rate, regular rhythm. Grossly normal heart sounds.  Good peripheral circulation. Respiratory: Normal respiratory effort.  No retractions. Lungs CTAB. Gastrointestinal: Abdomen is firm patient denies any pain on palpation but appears to wince when I do palpate his belly No distention. No abdominal bruits. No CVA tenderness. Musculoskeletal: No lower extremity tenderness nor edema.  No joint effusions. Neurologic: Patient speech was somewhat slurry possibly from lack of teeth and language. No gross focal neurologic deficits are appreciated.  he was also somewhat confused he knew what the  year was and couldn't give me his address but thought it was December and could not remember who the president was although he said he could not pronounces name either Skin:  Skin is warm, dry and intact. No rash noted. Psychiatric: Patient mood appear to be normal  ____________________________________________   LABS (all labs ordered are listed, but only abnormal results are displayed)  Labs Reviewed   ACETAMINOPHEN LEVEL - Abnormal; Notable for the following:    Acetaminophen (Tylenol), Serum <10 (*)    All other components within normal limits  COMPREHENSIVE METABOLIC PANEL - Abnormal; Notable for the following:    Chloride 100 (*)    Glucose, Bld 137 (*)    BUN 24 (*)    Calcium 8.6 (*)    Albumin 2.2 (*)    Alkaline Phosphatase 199 (*)    Total Bilirubin 1.7 (*)    All other components within normal limits  CBC WITH DIFFERENTIAL/PLATELET - Abnormal; Notable for the following:    RBC 3.51 (*)    Hemoglobin 9.7 (*)    HCT 29.8 (*)    Neutro Abs 7.6 (*)    Lymphs Abs 0.8 (*)    All other components within normal limits  URINALYSIS COMPLETEWITH MICROSCOPIC ([facility])  - Abnormal; Notable for the following:    Color, Urine AMBER (*)    APPearance CLEAR (*)    Bacteria, UA RARE (*)    Squamous Epithelial / LPF 0-5 (*)    All other components within normal limits  ETHANOL  TROPONIN I  LACTIC ACID, PLASMA  LACTIC ACID, PLASMA  URINE DRUG SCREEN, QUALITATIVE ([facility])   ____________________________________________  EKG  EKG shows normal sinus rhythm at a rate of 84 left axis no acute changes she does have some little low voltage ____________________________________________  RADIOLOGY  Head CT shows atrophy abdominal CT abdominal pelvic CT does not show any acute problems however the chest CT shows a right apical mass eroding into the third rib ____________________________________________   PROCEDURES  Procedure(s) performed: None  Critical Care performed: No  ____________________________________________   INITIAL IMPRESSION / ASSESSMENT AND PLAN / ED COURSE  Pertinent labs & imaging results that were available during my care of the patient were reviewed by me and considered in my medical decision making (see chart for details).   ____________________________________________   FINAL CLINICAL IMPRESSION(S) / ED DIAGNOSES  Final diagnoses:  Chest mass      Nena Polio, MD 01/14/15 2209

## 2015-01-14 NOTE — ED Notes (Signed)
BEHAVIORAL HEALTH ROUNDING Patient sleeping: Yes.   Patient alert and oriented: yes Behavior appropriate: Yes.  ; If no, describe:  Nutrition and fluids offered: Yes  Toileting and hygiene offered: Yes  Sitter present: yes Law enforcement present: Yes  

## 2015-01-14 NOTE — ED Notes (Signed)
wife in with pt now.

## 2015-01-14 NOTE — ED Notes (Signed)
Patient transported to CT 

## 2015-01-14 NOTE — ED Notes (Signed)
Pt. Arrived to ed via ems from home. Reports [employer] took IVC paper work out due to pt being non-complaint with medications over the last few days, not getting out of bed and using bathroom on self. EMS reported they were concern for his health. PT alert and oriented x 2 (oriented to self and place). Pt denies SI and HI on arrival.  2.'5mg'$ . Albuterol administered by EMS.

## 2015-01-14 NOTE — ED Notes (Signed)
BEHAVIORAL HEALTH ROUNDING Patient sleeping: No. Patient alert and oriented: yes Behavior appropriate: Yes.  ; If no, describe:  Nutrition and fluids offered: Yes  Toileting and hygiene offered: Yes  Sitter present: yes Law enforcement present: Yes  

## 2015-01-15 ENCOUNTER — Encounter: Payer: Self-pay | Admitting: *Deleted

## 2015-01-15 ENCOUNTER — Ambulatory Visit (HOSPITAL_COMMUNITY): Payer: Self-pay

## 2015-01-15 DIAGNOSIS — F0390 Unspecified dementia without behavioral disturbance: Secondary | ICD-10-CM

## 2015-01-15 DIAGNOSIS — C3490 Malignant neoplasm of unspecified part of unspecified bronchus or lung: Secondary | ICD-10-CM | POA: Diagnosis present

## 2015-01-15 DIAGNOSIS — F319 Bipolar disorder, unspecified: Secondary | ICD-10-CM

## 2015-01-15 DIAGNOSIS — J449 Chronic obstructive pulmonary disease, unspecified: Secondary | ICD-10-CM

## 2015-01-15 DIAGNOSIS — F209 Schizophrenia, unspecified: Secondary | ICD-10-CM

## 2015-01-15 DIAGNOSIS — F418 Other specified anxiety disorders: Secondary | ICD-10-CM

## 2015-01-15 DIAGNOSIS — Z515 Encounter for palliative care: Secondary | ICD-10-CM

## 2015-01-15 DIAGNOSIS — Z87891 Personal history of nicotine dependence: Secondary | ICD-10-CM

## 2015-01-15 DIAGNOSIS — K589 Irritable bowel syndrome without diarrhea: Secondary | ICD-10-CM

## 2015-01-15 DIAGNOSIS — F410 Panic disorder [episodic paroxysmal anxiety] without agoraphobia: Secondary | ICD-10-CM

## 2015-01-15 DIAGNOSIS — R627 Adult failure to thrive: Secondary | ICD-10-CM

## 2015-01-15 DIAGNOSIS — Z79899 Other long term (current) drug therapy: Secondary | ICD-10-CM

## 2015-01-15 DIAGNOSIS — R222 Localized swelling, mass and lump, trunk: Secondary | ICD-10-CM

## 2015-01-15 DIAGNOSIS — E785 Hyperlipidemia, unspecified: Secondary | ICD-10-CM

## 2015-01-15 DIAGNOSIS — R634 Abnormal weight loss: Secondary | ICD-10-CM

## 2015-01-15 LAB — CBC
HCT: 30.3 % — ABNORMAL LOW (ref 40.0–52.0)
Hemoglobin: 9.8 g/dL — ABNORMAL LOW (ref 13.0–18.0)
MCH: 27.3 pg (ref 26.0–34.0)
MCHC: 32.4 g/dL (ref 32.0–36.0)
MCV: 84.3 fL (ref 80.0–100.0)
Platelets: 470 10*3/uL — ABNORMAL HIGH (ref 150–440)
RBC: 3.6 MIL/uL — AB (ref 4.40–5.90)
RDW: 14 % (ref 11.5–14.5)
WBC: 11.4 10*3/uL — ABNORMAL HIGH (ref 3.8–10.6)

## 2015-01-15 LAB — BASIC METABOLIC PANEL
Anion gap: 10 (ref 5–15)
BUN: 21 mg/dL — ABNORMAL HIGH (ref 6–20)
CALCIUM: 8.6 mg/dL — AB (ref 8.9–10.3)
CO2: 25 mmol/L (ref 22–32)
Chloride: 100 mmol/L — ABNORMAL LOW (ref 101–111)
Creatinine, Ser: 1.04 mg/dL (ref 0.61–1.24)
GFR calc Af Amer: >60 mL/min (ref >60)
GLUCOSE: 102 mg/dL — AB (ref 65–99)
Potassium: 4.1 mmol/L (ref 3.5–5.1)
SODIUM: 135 mmol/L (ref 135–145)

## 2015-01-15 LAB — PHOSPHORUS: PHOSPHORUS: 3.1 mg/dL (ref 2.5–4.6)

## 2015-01-15 LAB — CREATININE, SERUM
CREATININE: 1.02 mg/dL (ref 0.61–1.24)
GFR calc non Af Amer: >60 mL/min (ref >60)

## 2015-01-15 LAB — MAGNESIUM: Magnesium: 1.9 mg/dL (ref 1.7–2.4)

## 2015-01-15 MED ORDER — HALOPERIDOL LACTATE 5 MG/ML IJ SOLN
1.0000 mg | Freq: Four times a day (QID) | INTRAMUSCULAR | Status: DC | PRN
Start: 2015-01-15 — End: 2015-01-27
  Administered 2015-01-25: 12:00:00 1 mg via INTRAVENOUS
  Filled 2015-01-15 (×2): qty 1

## 2015-01-15 MED ORDER — LITHIUM CARBONATE 150 MG PO CAPS
150.0000 mg | ORAL_CAPSULE | Freq: Two times a day (BID) | ORAL | Status: DC
  Filled 2015-01-15 (×3): qty 1

## 2015-01-15 MED ORDER — TRAZODONE HCL 100 MG PO TABS
100.0000 mg | ORAL_TABLET | Freq: Every day | ORAL | Status: DC
  Administered 2015-01-15 – 2015-01-26 (×12): 100 mg via ORAL
  Filled 2015-01-15 (×12): qty 1

## 2015-01-15 MED ORDER — LITHIUM CITRATE 300 MG/5 ML PO SYRP
150.0000 mg | Freq: Two times a day (BID) | ORAL | Status: DC
  Administered 2015-01-16 – 2015-01-22 (×14): 150 mg via ORAL
  Filled 2015-01-15 (×15): qty 2.5

## 2015-01-15 MED ORDER — HALOPERIDOL LACTATE 5 MG/ML IJ SOLN
1.0000 mg | INTRAMUSCULAR | Status: DC | PRN
Start: 2015-01-15 — End: 2015-01-27
  Administered 2015-01-23 – 2015-01-24 (×5): 1 mg via INTRAVENOUS
  Filled 2015-01-15 (×5): qty 1

## 2015-01-15 MED ORDER — QUETIAPINE FUMARATE 200 MG PO TABS
400.0000 mg | ORAL_TABLET | Freq: Every day | ORAL | Status: DC
  Administered 2015-01-15 – 2015-01-24 (×10): 400 mg via ORAL
  Filled 2015-01-15 (×10): qty 2

## 2015-01-15 MED ORDER — BUPROPION HCL ER (SR) 150 MG PO TB12
150.0000 mg | ORAL_TABLET | Freq: Two times a day (BID) | ORAL | Status: DC
  Administered 2015-01-15 – 2015-01-27 (×25): 150 mg via ORAL
  Filled 2015-01-15 (×48): qty 1

## 2015-01-15 MED ORDER — CLONAZEPAM 0.5 MG PO TABS
0.5000 mg | ORAL_TABLET | Freq: Two times a day (BID) | ORAL | Status: DC | PRN
  Administered 2015-01-15: 0.5 mg via ORAL
  Filled 2015-01-15: qty 1

## 2015-01-15 MED ORDER — LEVOTHYROXINE SODIUM 25 MCG PO TABS
25.0000 ug | ORAL_TABLET | Freq: Every day | ORAL | Status: DC
  Administered 2015-01-16 – 2015-01-27 (×12): 25 ug via ORAL
  Filled 2015-01-15 (×12): qty 1

## 2015-01-15 MED ORDER — QUETIAPINE FUMARATE 200 MG PO TABS: 200.0000 mg | ORAL_TABLET | Freq: Every day | ORAL | Status: DC

## 2015-01-15 MED ORDER — PAROXETINE HCL 20 MG PO TABS
40.0000 mg | ORAL_TABLET | Freq: Every day | ORAL | Status: DC
  Administered 2015-01-16 – 2015-01-27 (×12): 40 mg via ORAL
  Filled 2015-01-15 (×12): qty 2

## 2015-01-15 MED ORDER — SIMVASTATIN 20 MG PO TABS
20.0000 mg | ORAL_TABLET | Freq: Every day | ORAL | Status: DC
  Administered 2015-01-16 – 2015-01-26 (×11): 20 mg via ORAL
  Filled 2015-01-15 (×11): qty 1

## 2015-01-15 NOTE — Care Management Note (Signed)
Case Management Note  Patient Details  Name: Vincent Andrade MRN: [id number] Date of Birth: January 01, 1936  Subjective/Objective:       Contacted Dr Bridgett Larsson to have pt. Made OBSV. He has said he will not do so until he sees pt. Called Dr Doy Hutching who agrees pt. S/b OBSV. Called Spouse of PT. [name], to let her know pt. meets OBSV critieria  .Obsv letter reviewed.            Action/Plan:Will call [name] , CSW to advise her of conversation wilt [name], and spouse.    Expected Discharge Date:                  Expected Discharge Plan:     In-House Referral:     Discharge planning Services     Post Acute Care Choice:    Choice offered to:     DME Arranged:    DME Agency:     HH Arranged:    [place] Agency:     Status of Service:     Medicare Important Message Given:    Date Medicare IM Given:    Medicare IM give by:    Date Additional Medicare IM Given:    Additional Medicare Important Message give by:     If discussed at [place] of Stay Meetings, dates discussed:    Additional Comments:  [name], RN 01/15/2015, 8:48 AM
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
## 2015-01-15 NOTE — Consult Note (Signed)
[facility]  [phone] [phone]  ID: Vincent Andrade OB: 10-19-1935  MR#: [id number]  AQT#:[id number]  No care team member to display  CHIEF COMPLAINT:  Chief Complaint  Patient presents with  . Failure To Thrive    INTERVAL HISTORY: Patient is a 79 year old male who presented to the emergency room with a chief complaint of failure to thrive. He has multiple psychiatry problems and intent was involuntary admission. Upon workup, patient was found to have a large chest mass eroding in 2 his chest wall. Prior to consult, patient and family had requested hospice services. He offered no complaints on evaluation.   REVIEW OF SYSTEMS:  Difficult to obtain given patient's unwillingness to answer many questions.  Review of Systems  Constitutional: Positive for weight loss.    As per HPI. Otherwise, a complete review of systems is negatve.  PAST MEDICAL HISTORY: Past Medical History  Diagnosis Date  . Asthma   . Bipolar disorder   . Depression   . Dementia   . HLD (hyperlipidemia)   . IBS (irritable bowel syndrome)   . Schizophrenia   . COPD (chronic obstructive pulmonary disease)   . Panic attacks   . Lung cancer 01/14/2015    PAST SURGICAL HISTORY: Past Surgical History  Procedure Laterality Date  . Appendectomy      FAMILY HISTORY Family History  Problem Relation Age of Onset  . Bipolar disorder    . Cancer      grandmother       ADVANCED DIRECTIVES:    HEALTH MAINTENANCE: History  Substance Use Topics  . Smoking status: Former Research scientist (life sciences)  . Smokeless tobacco: Not on file  . Alcohol Use: No     Colonoscopy:  PAP:  Bone density:  Lipid panel:  No Known Allergies  Current Facility-Administered Medications  Medication Dose Route Frequency Provider Last Rate Last Dose  . 0.9 %  sodium chloride infusion   Intravenous Continuous Lance Coon, MD 75 mL/hr at 01/15/15 0027    . acetaminophen (TYLENOL) tablet 650 mg   650 mg Oral Q6H PRN Lance Coon, MD       Or  . acetaminophen (TYLENOL) suppository 650 mg  650 mg Rectal Q6H PRN Lance Coon, MD      . buPROPion 21 [facility] SR) 12 hr tablet 150 mg  150 mg Oral BID Judith Part Mantzouris, NP   150 mg at 01/15/15 1557  . clonazePAM (KLONOPIN) tablet 0.5 mg  0.5 mg Oral BID PRN Judith Part Mantzouris, NP   0.5 mg at 01/15/15 1559  . enoxaparin (LOVENOX) injection 40 mg  40 mg Subcutaneous Q24H Lance Coon, MD   40 mg at 01/15/15 1426  . [START ON 01/16/2015] levothyroxine (SYNTHROID, LEVOTHROID) tablet 25 mcg  25 mcg Oral QAC breakfast Maudry Mayhew, NP      . PARoxetine (PAXIL) tablet 40 mg  40 mg Oral Daily Judith Part Mantzouris, NP      . QUEtiapine (SEROQUEL) tablet 200 mg  200 mg Oral QHS Judith Part Mantzouris, NP      . senna-docusate (Senokot-S) tablet 1 tablet  1 tablet Oral QHS PRN Lance Coon, MD      . simvastatin (ZOCOR) tablet 20 mg  20 mg Oral q1800 Judith Part Mantzouris, NP      . traZODone (DESYREL) tablet 100 mg  100 mg Oral QHS Maudry Mayhew, NP        OBJECTIVE: Filed Vitals:   01/15/15 1437  BP: 112/78  Pulse: 81  Temp: 98.1 F (36.7 C)  Resp:      Body mass index is 17.29 kg/(m^2).    ECOG FS:2 - Symptomatic, <50% confined to bed  General: Cachectic, no acute distress. Eyes: Pink conjunctiva, anicteric sclera. HEENT: Normocephalic, moist mucous membranes, clear oropharnyx. Lungs: Clear to auscultation bilaterally. Heart: Regular rate and rhythm. No rubs, murmurs, or gallops. Abdomen: Soft, nontender, nondistended. No organomegaly noted, normoactive bowel sounds. Musculoskeletal: No edema, cyanosis, or clubbing. Neuro: Alert, answering all questions appropriately. Cranial nerves grossly intact. Skin: No rashes or petechiae noted. Psych: Normal affect.    LAB RESULTS:  Lab Results  Component Value Date   NA 135 01/15/2015   K 4.1 01/15/2015   CL 100* 01/15/2015   CO2 25 01/15/2015   GLUCOSE 102* 01/15/2015    BUN 21* 01/15/2015   CREATININE 1.02 01/15/2015   CREATININE 1.04 01/15/2015   CALCIUM 8.6* 01/15/2015   PROT 6.9 01/14/2015   ALBUMIN 2.2* 01/14/2015   AST 40 01/14/2015   ALT 46 01/14/2015   ALKPHOS 199* 01/14/2015   BILITOT 1.7* 01/14/2015   GFRNONAA >60 01/15/2015   GFRNONAA >60 01/15/2015   GFRAA >60 01/15/2015   GFRAA >60 01/15/2015    Lab Results  Component Value Date   WBC 11.4* 01/15/2015   NEUTROABS 7.6* 01/14/2015   HGB 9.8* 01/15/2015   HCT 30.3* 01/15/2015   MCV 84.3 01/15/2015   PLT 470* 01/15/2015     STUDIES: Ct Head Wo Contrast  01/14/2015   CLINICAL DATA:  Weight loss with loss of appetite. No history of malignancy. Initial encounter.  EXAM: CT HEAD WITHOUT CONTRAST  TECHNIQUE: Contiguous axial images were obtained from the base of the skull through the vertex without intravenous contrast.  COMPARISON:  None.  FINDINGS: There is no evidence of acute intracranial hemorrhage, mass lesion, brain edema or extra-axial fluid collection. The ventricles and subarachnoid spaces are diffusely prominent consistent with mild atrophy for age. There is no evidence of cortical stroke. Mild intracranial vascular calcifications are noted.  The visualized paranasal sinuses, mastoid air cells and middle ears are clear. The calvarium is intact.  IMPRESSION: Mild generalized atrophy.  No acute intracranial findings.   Electronically Signed   By: Richardean Sale M.D.   On: 01/14/2015 20:30   Ct Chest W Contrast  01/14/2015   CLINICAL DATA:  Altered mental status with weight loss and loss of appetite. No history of malignancy. Previous cholecystectomy and appendectomy. Initial encounter.  EXAM: CT CHEST, ABDOMEN, AND PELVIS WITH CONTRAST  TECHNIQUE: Multidetector CT imaging of the chest, abdomen and pelvis was performed following the standard protocol during bolus administration of intravenous contrast.  CONTRAST:  18m OMNIPAQUE IOHEXOL 300 MG/ML  SOLN  COMPARISON:  Abdominal pelvic CT  08/06/2007. Chest radiographs 08/15/2007.  FINDINGS: CT CHEST FINDINGS  Mediastinum/Nodes: There are enlarged mediastinal and right hilar lymph nodes. There is a right paratracheal node measuring 18 mm on image 20 and a 13 mm right paratracheal node on image 23. There is a 13 mm right hilar node on image 26. The thyroid gland, trachea and esophagus demonstrate no significant findings. The heart size is normal. There is no pericardial effusion.There is atherosclerosis of the aorta, great vessels and coronary arteries.  Lungs/Pleura: There is no pleural effusion.Advanced emphysematous changes are present. There is a right apical mass with chest wall destruction and surrounding parenchymal opacity. The mass measures up to 5.6 x 3.3 cm transverse on axial image 9. There is erosion and  partial destruction of the right third rib posteriorly. No other pulmonary nodules identified.  Musculoskeletal/Chest wall: As above, right apical mass invades the chest wall and partially destroys the right third rib. No distant osseous metastases identified. 4.2 cm probable sebaceous cyst in the right anterior chest wall is unchanged.  CT ABDOMEN AND PELVIS FINDINGS  Hepatobiliary: The liver is normal in density without focal abnormality. No biliary dilatation status post cholecystectomy.  Pancreas: Unremarkable. No pancreatic ductal dilatation or surrounding inflammatory changes.  Spleen: Normal in size without focal abnormality.  Adrenals/Urinary Tract: Both adrenal glands appear normal.Both kidneys demonstrate mild cortical thinning. There are tiny low-density renal lesions bilaterally, likely cysts. No evidence of enhancing mass or urinary tract calculus. No hydronephrosis. No significant bladder findings.  Stomach/Bowel: No evidence of bowel wall thickening, distention or surrounding inflammatory change.  Vascular/Lymphatic: There are no enlarged abdominal or pelvic lymph nodes. Moderate aortoiliac atherosclerosis.  Reproductive:  Unremarkable.  Other: No evidence of abdominal wall mass or hernia.  Musculoskeletal: No acute or significant osseous findings.  IMPRESSION: 1. Right apical lung mass with chest wall invasion, partial destruction of the right third rib and mediastinal adenopathy consistent with bronchogenic carcinoma. 2. Adjacent parenchymal opacities appear partially chronic in correlation with prior radiographs. There may be a component of adjacent inflammation as well. 3. No evidence of metastatic disease within the abdomen or pelvis. 4. Moderate atherosclerosis. 5. Advanced emphysema.   Electronically Signed   By: Richardean Sale M.D.   On: 01/14/2015 20:41   Ct Abdomen Pelvis W Contrast  01/14/2015   CLINICAL DATA:  Altered mental status with weight loss and loss of appetite. No history of malignancy. Previous cholecystectomy and appendectomy. Initial encounter.  EXAM: CT CHEST, ABDOMEN, AND PELVIS WITH CONTRAST  TECHNIQUE: Multidetector CT imaging of the chest, abdomen and pelvis was performed following the standard protocol during bolus administration of intravenous contrast.  CONTRAST:  163m OMNIPAQUE IOHEXOL 300 MG/ML  SOLN  COMPARISON:  Abdominal pelvic CT 08/06/2007. Chest radiographs 08/15/2007.  FINDINGS: CT CHEST FINDINGS  Mediastinum/Nodes: There are enlarged mediastinal and right hilar lymph nodes. There is a right paratracheal node measuring 18 mm on image 20 and a 13 mm right paratracheal node on image 23. There is a 13 mm right hilar node on image 26. The thyroid gland, trachea and esophagus demonstrate no significant findings. The heart size is normal. There is no pericardial effusion.There is atherosclerosis of the aorta, great vessels and coronary arteries.  Lungs/Pleura: There is no pleural effusion.Advanced emphysematous changes are present. There is a right apical mass with chest wall destruction and surrounding parenchymal opacity. The mass measures up to 5.6 x 3.3 cm transverse on axial image 9. There  is erosion and partial destruction of the right third rib posteriorly. No other pulmonary nodules identified.  Musculoskeletal/Chest wall: As above, right apical mass invades the chest wall and partially destroys the right third rib. No distant osseous metastases identified. 4.2 cm probable sebaceous cyst in the right anterior chest wall is unchanged.  CT ABDOMEN AND PELVIS FINDINGS  Hepatobiliary: The liver is normal in density without focal abnormality. No biliary dilatation status post cholecystectomy.  Pancreas: Unremarkable. No pancreatic ductal dilatation or surrounding inflammatory changes.  Spleen: Normal in size without focal abnormality.  Adrenals/Urinary Tract: Both adrenal glands appear normal.Both kidneys demonstrate mild cortical thinning. There are tiny low-density renal lesions bilaterally, likely cysts. No evidence of enhancing mass or urinary tract calculus. No hydronephrosis. No significant bladder findings.  Stomach/Bowel: No evidence  of bowel wall thickening, distention or surrounding inflammatory change.  Vascular/Lymphatic: There are no enlarged abdominal or pelvic lymph nodes. Moderate aortoiliac atherosclerosis.  Reproductive: Unremarkable.  Other: No evidence of abdominal wall mass or hernia.  Musculoskeletal: No acute or significant osseous findings.  IMPRESSION: 1. Right apical lung mass with chest wall invasion, partial destruction of the right third rib and mediastinal adenopathy consistent with bronchogenic carcinoma. 2. Adjacent parenchymal opacities appear partially chronic in correlation with prior radiographs. There may be a component of adjacent inflammation as well. 3. No evidence of metastatic disease within the abdomen or pelvis. 4. Moderate atherosclerosis. 5. Advanced emphysema.   Electronically Signed   By: Richardean Sale M.D.   On: 01/14/2015 20:41    ASSESSMENT: Clinical stage IIIB lung cancer, failure to thrive.  PLAN:     1. Lung mass:  Highly suspicious for  underlying malignancy. Patient and his family have stated that he would not want treatment and have requested a hospice referral. Appreciate palliative care input. They did not have any further questions regarding the diagnosis or patient's prognosis.  Patient is also now DO NOT RESUSCITATE/DO NOT INTUBATE. No further intervention is needed. No outpatient oncology follow-up is necessary.  Appreciate consult, call with questions.    Lloyd Huger, MD   01/15/2015 4:07 PM

## 2015-01-15 NOTE — Progress Notes (Signed)
   01/15/15 0900  Clinical Encounter Type  Visited With Patient  Visit Type Initial  Referral From Nurse  Spiritual Encounters  Spiritual Needs Prayer  Patient had requested prayer from the night nurse. When I went in, patient told me he wasn't interested in prayer.  He then changed his mind and asked me to recite a Catholic prayer. I told him that I was not familiar with it, due to the fact that I'm not [place].  He said the prayer and I repeated it till he could no longer remember the rest.  Patient did thank me for coming to visit and pray with him.  [place] [phone]

## 2015-01-15 NOTE — BH Assessment (Signed)
This is a 79 y.o. Cauc. Male, who presents to the ED via EMS for c/o, "I have anxiety; that's why I'm here; I can't tell you much; I don't want to hurt myself; my wife lives four blocks down the street from me; i have 3 daughters; they live in [place]; they don't have anything to do with me."

## 2015-01-15 NOTE — Progress Notes (Signed)
[facility] [place] NAME: Clete Kuch    MR#:  [id number]  DATE OF BIRTH:  1936/05/15  SUBJECTIVE:  CHIEF COMPLAINT:   Chief Complaint  Patient presents with  . Failure To Thrive   The patient has no complaint, he wants to go home. REVIEW OF SYSTEMS:  Denies any symptoms, not reliable.  DRUG ALLERGIES:  No Known Allergies  VITALS:  Blood pressure 112/78, pulse 81, temperature 98.1 F (36.7 C), temperature source Oral, resp. rate 18, height '6\' 1"'$  (1.854 m), weight 59.421 kg (131 lb), SpO2 97 %.  PHYSICAL EXAMINATION:  GENERAL:  79 y.o.-year-old patient lying in the bed with no acute distress. Thin. EYES:  No scleral icterus. Extraocular muscles intact.  HEENT: Head atraumatic, normocephalic. Oropharynx and nasopharynx clear.  NECK:  Supple, no jugular venous distention. No thyroid enlargement, no tenderness.  LUNGS: Normal breath sounds bilaterally, mild wheezing, no rales,rhonchi or crepitation. No use of accessory muscles of respiration.  CARDIOVASCULAR: S1, S2 normal. No murmurs, rubs, or gallops.  ABDOMEN: Soft, nontender, nondistended. Bowel sounds present. No organomegaly or mass.  EXTREMITIES: No pedal edema, cyanosis, or clubbing.  NEUROLOGIC: Cranial nerves II through XII are intact. Muscle strength 5/5 in all extremities. Sensation intact. Gait not checked.  PSYCHIATRIC: The patient is alert and oriented x 3.  SKIN: No obvious rash, lesion, or ulcer.    LABORATORY PANEL:   CBC  Recent Labs Lab 01/15/15 0128  WBC 11.4*  HGB 9.8*  HCT 30.3*  PLT 470*   ------------------------------------------------------------------------------------------------------------------  Chemistries   Recent Labs Lab 01/14/15 1837 01/15/15 0128  NA 136 135  K 4.6 4.1  CL 100* 100*  CO2 28 25  GLUCOSE 137* 102*  BUN 24* 21*  CREATININE 1.04 1.04  1.02  CALCIUM 8.6* 8.6*  MG  --  1.9  AST 40  --   ALT 46  --    ALKPHOS 199*  --   BILITOT 1.7*  --    ------------------------------------------------------------------------------------------------------------------  Cardiac Enzymes  Recent Labs Lab 01/14/15 1837  TROPONINI <0.03   ------------------------------------------------------------------------------------------------------------------  RADIOLOGY:  Ct Head Wo Contrast  01/14/2015   CLINICAL DATA:  Weight loss with loss of appetite. No history of malignancy. Initial encounter.  EXAM: CT HEAD WITHOUT CONTRAST  TECHNIQUE: Contiguous axial images were obtained from the base of the skull through the vertex without intravenous contrast.  COMPARISON:  None.  FINDINGS: There is no evidence of acute intracranial hemorrhage, mass lesion, brain edema or extra-axial fluid collection. The ventricles and subarachnoid spaces are diffusely prominent consistent with mild atrophy for age. There is no evidence of cortical stroke. Mild intracranial vascular calcifications are noted.  The visualized paranasal sinuses, mastoid air cells and middle ears are clear. The calvarium is intact.  IMPRESSION: Mild generalized atrophy.  No acute intracranial findings.   Electronically Signed   By: Richardean Sale M.D.   On: 01/14/2015 20:30   Ct Chest W Contrast  01/14/2015   CLINICAL DATA:  Altered mental status with weight loss and loss of appetite. No history of malignancy. Previous cholecystectomy and appendectomy. Initial encounter.  EXAM: CT CHEST, ABDOMEN, AND PELVIS WITH CONTRAST  TECHNIQUE: Multidetector CT imaging of the chest, abdomen and pelvis was performed following the standard protocol during bolus administration of intravenous contrast.  CONTRAST:  136m OMNIPAQUE IOHEXOL 300 MG/ML  SOLN  COMPARISON:  Abdominal pelvic CT 08/06/2007. Chest radiographs 08/15/2007.  FINDINGS: CT CHEST FINDINGS  Mediastinum/Nodes: There are  enlarged mediastinal and right hilar lymph nodes. There is a right paratracheal node  measuring 18 mm on image 20 and a 13 mm right paratracheal node on image 23. There is a 13 mm right hilar node on image 26. The thyroid gland, trachea and esophagus demonstrate no significant findings. The heart size is normal. There is no pericardial effusion.There is atherosclerosis of the aorta, great vessels and coronary arteries.  Lungs/Pleura: There is no pleural effusion.Advanced emphysematous changes are present. There is a right apical mass with chest wall destruction and surrounding parenchymal opacity. The mass measures up to 5.6 x 3.3 cm transverse on axial image 9. There is erosion and partial destruction of the right third rib posteriorly. No other pulmonary nodules identified.  Musculoskeletal/Chest wall: As above, right apical mass invades the chest wall and partially destroys the right third rib. No distant osseous metastases identified. 4.2 cm probable sebaceous cyst in the right anterior chest wall is unchanged.  CT ABDOMEN AND PELVIS FINDINGS  Hepatobiliary: The liver is normal in density without focal abnormality. No biliary dilatation status post cholecystectomy.  Pancreas: Unremarkable. No pancreatic ductal dilatation or surrounding inflammatory changes.  Spleen: Normal in size without focal abnormality.  Adrenals/Urinary Tract: Both adrenal glands appear normal.Both kidneys demonstrate mild cortical thinning. There are tiny low-density renal lesions bilaterally, likely cysts. No evidence of enhancing mass or urinary tract calculus. No hydronephrosis. No significant bladder findings.  Stomach/Bowel: No evidence of bowel wall thickening, distention or surrounding inflammatory change.  Vascular/Lymphatic: There are no enlarged abdominal or pelvic lymph nodes. Moderate aortoiliac atherosclerosis.  Reproductive: Unremarkable.  Other: No evidence of abdominal wall mass or hernia.  Musculoskeletal: No acute or significant osseous findings.  IMPRESSION: 1. Right apical lung mass with chest wall  invasion, partial destruction of the right third rib and mediastinal adenopathy consistent with bronchogenic carcinoma. 2. Adjacent parenchymal opacities appear partially chronic in correlation with prior radiographs. There may be a component of adjacent inflammation as well. 3. No evidence of metastatic disease within the abdomen or pelvis. 4. Moderate atherosclerosis. 5. Advanced emphysema.   Electronically Signed   By: Richardean Sale M.D.   On: 01/14/2015 20:41   Ct Abdomen Pelvis W Contrast  01/14/2015   CLINICAL DATA:  Altered mental status with weight loss and loss of appetite. No history of malignancy. Previous cholecystectomy and appendectomy. Initial encounter.  EXAM: CT CHEST, ABDOMEN, AND PELVIS WITH CONTRAST  TECHNIQUE: Multidetector CT imaging of the chest, abdomen and pelvis was performed following the standard protocol during bolus administration of intravenous contrast.  CONTRAST:  134m OMNIPAQUE IOHEXOL 300 MG/ML  SOLN  COMPARISON:  Abdominal pelvic CT 08/06/2007. Chest radiographs 08/15/2007.  FINDINGS: CT CHEST FINDINGS  Mediastinum/Nodes: There are enlarged mediastinal and right hilar lymph nodes. There is a right paratracheal node measuring 18 mm on image 20 and a 13 mm right paratracheal node on image 23. There is a 13 mm right hilar node on image 26. The thyroid gland, trachea and esophagus demonstrate no significant findings. The heart size is normal. There is no pericardial effusion.There is atherosclerosis of the aorta, great vessels and coronary arteries.  Lungs/Pleura: There is no pleural effusion.Advanced emphysematous changes are present. There is a right apical mass with chest wall destruction and surrounding parenchymal opacity. The mass measures up to 5.6 x 3.3 cm transverse on axial image 9. There is erosion and partial destruction of the right third rib posteriorly. No other pulmonary nodules identified.  Musculoskeletal/Chest wall: As above, right apical  mass invades the  chest wall and partially destroys the right third rib. No distant osseous metastases identified. 4.2 cm probable sebaceous cyst in the right anterior chest wall is unchanged.  CT ABDOMEN AND PELVIS FINDINGS  Hepatobiliary: The liver is normal in density without focal abnormality. No biliary dilatation status post cholecystectomy.  Pancreas: Unremarkable. No pancreatic ductal dilatation or surrounding inflammatory changes.  Spleen: Normal in size without focal abnormality.  Adrenals/Urinary Tract: Both adrenal glands appear normal.Both kidneys demonstrate mild cortical thinning. There are tiny low-density renal lesions bilaterally, likely cysts. No evidence of enhancing mass or urinary tract calculus. No hydronephrosis. No significant bladder findings.  Stomach/Bowel: No evidence of bowel wall thickening, distention or surrounding inflammatory change.  Vascular/Lymphatic: There are no enlarged abdominal or pelvic lymph nodes. Moderate aortoiliac atherosclerosis.  Reproductive: Unremarkable.  Other: No evidence of abdominal wall mass or hernia.  Musculoskeletal: No acute or significant osseous findings.  IMPRESSION: 1. Right apical lung mass with chest wall invasion, partial destruction of the right third rib and mediastinal adenopathy consistent with bronchogenic carcinoma. 2. Adjacent parenchymal opacities appear partially chronic in correlation with prior radiographs. There may be a component of adjacent inflammation as well. 3. No evidence of metastatic disease within the abdomen or pelvis. 4. Moderate atherosclerosis. 5. Advanced emphysema.   Electronically Signed   By: Richardean Sale M.D.   On: 01/14/2015 20:41    EKG:   Orders placed or performed during the hospital encounter of 01/14/15  . ED EKG  . ED EKG  . EKG 12-Lead  . 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 Palliative care staff d/w pt's FM, now on DNR. F/u hospice screen.   Failure to thrive in adult  -dietitian consult.  Depression - f/u psychiatry consult, on IVC.  Bipolar disorder -psychiatry consult as above.   COPD (chronic obstructive pulmonary disease) -stable.  D/w pt's wife and daughter. All the records are reviewed and case discussed with Care Management/Social Workerr. Management plans discussed with the patient, family and they are in agreement.  CODE STATUS: DNR TOTAL TIME TAKING CARE OF THIS PATIENT: 46  minutes.   POSSIBLE D/C IN 2 AYS, DEPENDING ON CLINICAL CONDITION.   Demetrios Loll M.D on 01/15/2015 at 2:57 PM  Between 7am to 6pm - Pager - [phone]  After 6pm go to [email or web address] - password [email or web address] [facility]  [facility]  Office  [phone]  CC: Primary care physician; No primary care provider on file.

## 2015-01-15 NOTE — Progress Notes (Signed)
OBSV  Letter info called to spouse; letter to HIM

## 2015-01-15 NOTE — Consult Note (Signed)
[facility] Consult   Reason for Consult:  This is a 79 year old man with a history of bipolar disorder currently in the hospital because of weight loss and poor self-care and possibly lung cancer. Consult because of agitation and mood disorderwalsh Referring Physician:  Volanda Andrade Patient Identification: Vincent Andrade MRN:  [id number] Principal Diagnosis: <principal problem not specified> Diagnosis:   Patient Active Problem List   Diagnosis Date Noted  . Lung cancer, primary, with metastasis from lung to other site [C34.90, C79.9] 01/15/2015  . Lung cancer [C34.90] 01/14/2015  . Depression [F32.9] 01/14/2015  . Bipolar disorder [F31.9] 01/14/2015  . COPD (chronic obstructive pulmonary disease) [J44.9] 01/14/2015  . Failure to thrive in adult [R62.7] 01/14/2015    Total Time spent with patient: 1 hour  Subjective:   Vincent Andrade is a 79 y.o. male patient admitted with patient was admitted because he has been losing weight and not taking care of himself and not eating and not taking in any liquid seems to be very sick. Also confused. Consult for agitation confusion history of bipolar disorder patient's chief complaint "this is a nightmare".  HPI:  Information from the patient and the chart. Patient was petitioned for commitment by his wife who came back to the house after being separated from him for a long time and found him to not be eating having no access to water not taking care of himself not bathing appearing to be more confused. Patient himself has no insight into any of this and can't remember what brought him into the hospital. Denies being aware of mood symptoms. He indicates that he is confused right now and agitated. Not clear what medicine he takes not clear if he's been taking everything is prescribed.  Past psychiatric history is positive for chronic mental illness. He had a hospitalization here in 2008 with a diagnosis of bipolar disorder treated with  Seroquel and lithium. He had had previous hospitalizations with diagnoses of schizophrenia. Patient seems to have had an ongoing problem with mood instability and psychotic symptoms. He can't tell me what medicines he is on but presumably he was still being prescribed Wellbutrin and Paxil and Seroquel as those medicines were started by the hospitalist here.  Patient denies any history of suicide attempts does appear to have been aggressive at times in the past.  Patient denies that he has a drinking problem or is ever had a drinking problem denies any drug abuse.  Social history: Patient is separated from his wife. This is been a long-standing issue because of his tendency to be threatening to her. It looks like they have been living apart at least since 2008. He is a retired Doctor, hospital from [employer]  He is unknown possibly negative  Substance abuse history is said to be negative  Medical history patient appears to possibly have a lung cancer. He is chronically treated for emphysema with a history of being oxygen dependent HPI Elements:   Quality:  Failure to thrive poor intake anxiety. Severity:  Severe with lots of weight loss and confusion. Timing:  Unclear probably just over the last couple weeks. Duration:  Long-standing problem. Context:  Medical problems possibly a lung cancer.  Past Medical History:  Past Medical History  Diagnosis Date  . Asthma   . Bipolar disorder   . Depression   . Dementia   . HLD (hyperlipidemia)   . IBS (irritable bowel syndrome)   . Schizophrenia   . COPD (chronic obstructive pulmonary disease)   .  Panic attacks   . Lung cancer 01/14/2015    Past Surgical History  Procedure Laterality Date  . Appendectomy     Family History:  Family History  Problem Relation Age of Onset  . Bipolar disorder    . Cancer      grandmother   Social History:  History  Alcohol Use No     History  Drug Use No    History   Social History  .  Marital Status: Single    Spouse Name: N/A  . Number of Children: N/A  . Years of Education: N/A   Social History Main Topics  . Smoking status: Former Research scientist (life sciences)  . Smokeless tobacco: Not on file  . Alcohol Use: No  . Drug Use: No  . Sexual Activity: Not on file   Other Topics Concern  . None   Social History Narrative   Additional Social History:                          Allergies:  No Known Allergies  Labs:  Results for orders placed or performed during the hospital encounter of 01/14/15 (from the past 48 hour(s))  Urinalysis complete, with microscopic     Status: Abnormal   Collection Time: 01/14/15  8:02 AM  Result Value Ref Range   Color, Urine AMBER (A) YELLOW   APPearance CLEAR (A) CLEAR   Glucose, UA NEGATIVE NEGATIVE mg/dL   Bilirubin Urine NEGATIVE NEGATIVE   Ketones, ur NEGATIVE NEGATIVE mg/dL   Specific Gravity, Urine 1.021 1.005 - 1.030   Hgb urine dipstick NEGATIVE NEGATIVE   pH 5.0 5.0 - 8.0   Protein, ur NEGATIVE NEGATIVE mg/dL   Nitrite NEGATIVE NEGATIVE   Leukocytes, UA NEGATIVE NEGATIVE   RBC / HPF 0-5 0 - 5 RBC/hpf   WBC, UA 0-5 0 - 5 WBC/hpf   Bacteria, UA RARE (A) NONE SEEN   Squamous Epithelial / LPF 0-5 (A) NONE SEEN   Mucous PRESENT   Urine Drug Screen, Qualitative     Status: Abnormal   Collection Time: 01/14/15  8:02 AM  Result Value Ref Range   Tricyclic, Ur Screen POSITIVE (A) NONE DETECTED   Amphetamines, Ur Screen NONE DETECTED NONE DETECTED   MDMA (Ecstasy)Ur Screen NONE DETECTED NONE DETECTED   Cocaine Metabolite,Ur [place] NONE DETECTED NONE DETECTED   Opiate, Ur Screen NONE DETECTED NONE DETECTED   Phencyclidine (PCP) Ur S NONE DETECTED NONE DETECTED   Cannabinoid 50 Ng, Ur [place] NONE DETECTED NONE DETECTED   Barbiturates, Ur Screen NONE DETECTED NONE DETECTED   Benzodiazepine, Ur Scrn POSITIVE (A) NONE DETECTED   Methadone Scn, Ur NONE DETECTED NONE DETECTED    Comment: (NOTE) 638  Tricyclics, urine               Cutoff 1000  ng/mL 200  Amphetamines, urine             Cutoff 1000 ng/mL 300  MDMA (Ecstasy), urine           Cutoff 500 ng/mL 400  Cocaine Metabolite, urine       Cutoff 300 ng/mL 500  Opiate, urine                   Cutoff 300 ng/mL 600  Phencyclidine (PCP), urine      Cutoff 25 ng/mL 700  Cannabinoid, urine              Cutoff 50 ng/mL 800  Barbiturates, urine             Cutoff 200 ng/mL 900  Benzodiazepine, urine           Cutoff 200 ng/mL 1000 Methadone, urine                Cutoff 300 ng/mL 1100 1200 The urine drug screen provides only a preliminary, unconfirmed 1300 analytical test result and should not be used for non-medical 1400 purposes. Clinical consideration and professional judgment should 1500 be applied to any positive drug screen result due to possible 1600 interfering substances. A more specific alternate chemical method 1700 must be used in order to obtain a confirmed analytical result.  1800 Gas chromato graphy / mass spectrometry (GC/MS) is the preferred 1900 confirmatory method.   Acetaminophen level     Status: Abnormal   Collection Time: 01/14/15  6:37 PM  Result Value Ref Range   Acetaminophen (Tylenol), Serum <10 (L) 10 - 30 ug/mL    Comment:        THERAPEUTIC CONCENTRATIONS VARY SIGNIFICANTLY. A RANGE OF 10-30 ug/mL MAY BE AN EFFECTIVE CONCENTRATION FOR MANY PATIENTS. HOWEVER, SOME ARE BEST TREATED AT CONCENTRATIONS OUTSIDE THIS RANGE. ACETAMINOPHEN CONCENTRATIONS >150 ug/mL AT 4 HOURS AFTER INGESTION AND >50 ug/mL AT 12 HOURS AFTER INGESTION ARE OFTEN ASSOCIATED WITH TOXIC REACTIONS.   Comprehensive metabolic panel     Status: Abnormal   Collection Time: 01/14/15  6:37 PM  Result Value Ref Range   Sodium 136 135 - 145 mmol/L   Potassium 4.6 3.5 - 5.1 mmol/L    Comment: HEMOLYSIS AT THIS LEVEL MAY AFFECT RESULT   Chloride 100 (L) 101 - 111 mmol/L   CO2 28 22 - 32 mmol/L   Glucose, Bld 137 (H) 65 - 99 mg/dL   BUN 24 (H) 6 - 20 mg/dL   Creatinine, Ser  1.04 0.61 - 1.24 mg/dL   Calcium 8.6 (L) 8.9 - 10.3 mg/dL   Total Protein 6.9 6.5 - 8.1 g/dL   Albumin 2.2 (L) 3.5 - 5.0 g/dL   AST 40 15 - 41 U/L   ALT 46 17 - 63 U/L   Alkaline Phosphatase 199 (H) 38 - 126 U/L   Total Bilirubin 1.7 (H) 0.3 - 1.2 mg/dL   GFR calc non Af Amer >60 >60 mL/min   GFR calc Af Amer >60 >60 mL/min    Comment: (NOTE) The eGFR has been calculated using the CKD EPI equation. This calculation has not been validated in all clinical situations. eGFR's persistently <60 mL/min signify possible Chronic Kidney Disease.    Anion gap 8 5 - 15  Ethanol     Status: None   Collection Time: 01/14/15  6:37 PM  Result Value Ref Range   Alcohol, Ethyl (B) <5 <5 mg/dL    Comment:        LOWEST DETECTABLE LIMIT FOR SERUM ALCOHOL IS 11 mg/dL FOR MEDICAL PURPOSES ONLY   Troponin I     Status: None   Collection Time: 01/14/15  6:37 PM  Result Value Ref Range   Troponin I <0.03 <0.031 ng/mL    Comment:        NO INDICATION OF MYOCARDIAL INJURY.   CBC with Differential     Status: Abnormal   Collection Time: 01/14/15  6:37 PM  Result Value Ref Range   WBC 9.6 3.8 - 10.6 K/uL   RBC 3.51 (L) 4.40 - 5.90 MIL/uL   Hemoglobin 9.7 (L) 13.0 - 18.0 g/dL  HCT 29.8 (L) 40.0 - 52.0 %   MCV 84.8 80.0 - 100.0 fL   MCH 27.7 26.0 - 34.0 pg   MCHC 32.7 32.0 - 36.0 g/dL   RDW 13.9 11.5 - 14.5 %   Platelets 353 150 - 440 K/uL   Neutrophils Relative % 79 %   Neutro Abs 7.6 (H) 1.4 - 6.5 K/uL   Lymphocytes Relative 8 %   Lymphs Abs 0.8 (L) 1.0 - 3.6 K/uL   Monocytes Relative 11 %   Monocytes Absolute 1.0 0.2 - 1.0 K/uL   Eosinophils Relative 1 %   Eosinophils Absolute 0.1 0 - 0.7 K/uL   Basophils Relative 1 %   Basophils Absolute 0.1 0 - 0.1 K/uL  Lactic acid, plasma     Status: None   Collection Time: 01/14/15  7:03 PM  Result Value Ref Range   Lactic Acid, Venous 1.2 0.5 - 2.0 mmol/L  CBC     Status: Abnormal   Collection Time: 01/15/15  1:28 AM  Result Value Ref Range    WBC 11.4 (H) 3.8 - 10.6 K/uL   RBC 3.60 (L) 4.40 - 5.90 MIL/uL   Hemoglobin 9.8 (L) 13.0 - 18.0 g/dL   HCT 30.3 (L) 40.0 - 52.0 %   MCV 84.3 80.0 - 100.0 fL   MCH 27.3 26.0 - 34.0 pg   MCHC 32.4 32.0 - 36.0 g/dL   RDW 14.0 11.5 - 14.5 %   Platelets 470 (H) 150 - 440 K/uL  Creatinine, serum     Status: None   Collection Time: 01/15/15  1:28 AM  Result Value Ref Range   Creatinine, Ser 1.02 0.61 - 1.24 mg/dL   GFR calc non Af Amer >60 >60 mL/min   GFR calc Af Amer >60 >60 mL/min    Comment: (NOTE) The eGFR has been calculated using the CKD EPI equation. This calculation has not been validated in all clinical situations. eGFR's persistently <60 mL/min signify possible Chronic Kidney Disease.   Basic metabolic panel     Status: Abnormal   Collection Time: 01/15/15  1:28 AM  Result Value Ref Range   Sodium 135 135 - 145 mmol/L   Potassium 4.1 3.5 - 5.1 mmol/L   Chloride 100 (L) 101 - 111 mmol/L   CO2 25 22 - 32 mmol/L   Glucose, Bld 102 (H) 65 - 99 mg/dL   BUN 21 (H) 6 - 20 mg/dL   Creatinine, Ser 1.04 0.61 - 1.24 mg/dL   Calcium 8.6 (L) 8.9 - 10.3 mg/dL   GFR calc non Af Amer >60 >60 mL/min   GFR calc Af Amer >60 >60 mL/min    Comment: (NOTE) The eGFR has been calculated using the CKD EPI equation. This calculation has not been validated in all clinical situations. eGFR's persistently <60 mL/min signify possible Chronic Kidney Disease.    Anion gap 10 5 - 15  Magnesium     Status: None   Collection Time: 01/15/15  1:28 AM  Result Value Ref Range   Magnesium 1.9 1.7 - 2.4 mg/dL  Phosphorus     Status: None   Collection Time: 01/15/15  1:28 AM  Result Value Ref Range   Phosphorus 3.1 2.5 - 4.6 mg/dL    Vitals: Blood pressure 112/78, pulse 81, temperature 98.1 F (36.7 C), temperature source Oral, resp. rate 18, height _0  (1.854 m), weight 59.421 kg (131 lb), SpO2 97 %.  Risk to Self: Is patient at risk for suicide?: No Risk  to Others:   Prior Inpatient Therapy:    Prior Outpatient Therapy:    Current Facility-Administered Medications  Medication Dose Route Frequency Provider Last Rate Last Dose  . 0.9 %  sodium chloride infusion   Intravenous Continuous Lance Coon, MD 75 mL/hr at 01/15/15 0027    . acetaminophen (TYLENOL) tablet 650 mg  650 mg Oral Q6H PRN Lance Coon, MD       Or  . acetaminophen (TYLENOL) suppository 650 mg  650 mg Rectal Q6H PRN Lance Coon, MD      . buPROPion [facility] SR) 12 hr tablet 150 mg  150 mg Oral BID Judith Part Mantzouris, NP   150 mg at 01/15/15 1557  . clonazePAM (KLONOPIN) tablet 0.5 mg  0.5 mg Oral BID PRN Judith Part Mantzouris, NP   0.5 mg at 01/15/15 1559  . enoxaparin (LOVENOX) injection 40 mg  40 mg Subcutaneous Q24H Lance Coon, MD   40 mg at 01/15/15 1426  . haloperidol lactate (HALDOL) injection 1 mg  1 mg Intravenous Q6H PRN Dustin Flock, MD      . Derrill Memo ON 01/16/2015] levothyroxine (SYNTHROID, LEVOTHROID) tablet 25 mcg  25 mcg Oral QAC breakfast Maudry Mayhew, NP      . Derrill Memo ON 01/16/2015] lithium carbonate capsule 150 mg  150 mg Oral BID WC Gonzella Lex, MD      . PARoxetine (PAXIL) tablet 40 mg  40 mg Oral Daily Judith Part Mantzouris, NP      . QUEtiapine (SEROQUEL) tablet 400 mg  400 mg Oral QHS Gonzella Lex, MD      . senna-docusate (Senokot-S) tablet 1 tablet  1 tablet Oral QHS PRN Lance Coon, MD      . simvastatin (ZOCOR) tablet 20 mg  20 mg Oral q1800 Judith Part Mantzouris, NP      . traZODone (DESYREL) tablet 100 mg  100 mg Oral QHS Maudry Mayhew, NP        Musculoskeletal: Strength & Muscle Tone: atrophy Gait & Station: unsteady Patient leans: N/A  Psychiatric Specialty Exam: Physical Exam  Constitutional: He appears cachectic. He is uncooperative.  Eyes: Pupils are equal, round, and reactive to light.  Neurological: He is disoriented.  Psychiatric: His mood appears anxious. His affect is labile. His speech is tangential. He is agitated. Thought content is  paranoid. Cognition and memory are impaired. He expresses impulsivity. He exhibits abnormal recent memory and abnormal remote memory.    Review of Systems  Constitutional: Positive for malaise/fatigue.  Psychiatric/Behavioral: Positive for memory loss. Negative for depression, suicidal ideas, hallucinations and substance abuse. The patient is nervous/anxious.     Blood pressure 112/78, pulse 81, temperature 98.1 F (36.7 C), temperature source Oral, resp. rate 18, height _0  (1.854 m), weight 59.421 kg (131 lb), SpO2 97 %.Body mass index is 17.29 kg/(m^2).  General Appearance: Disheveled and Guarded  Eye Sport and exercise psychologist::  Fair  Speech:  Pressured  Volume:  Decreased  Mood:  Anxious  Affect:  Labile  Thought Process:  Circumstantial  Orientation:  Other:  Patient did not know where he was couldn't tell me the month new the year.  Thought Content:  Delusions  Suicidal Thoughts:  No  Homicidal Thoughts:  No  Memory:  Immediate;   Poor Recent;   Poor Remote;   Poor  Judgement:  Poor  Insight:  Lacking  Psychomotor Activity:  Decreased  Concentration:  Poor  Recall:  Poor  Fund of Knowledge:Fair  Language: Fair  Akathisia:  No  Handed:  Right  AIMS (if indicated):     Assets:  Desire for Improvement Housing Social Support  ADL's:  Impaired  Cognition: Impaired,  Severe  Sleep:      Medical Decision Making: Established Problem, Worsening (2), Review or order medicine tests (1) and Review of Medication Regimen & Side Effects (2)  Treatment Plan Summary: Plan Patient appears to have a delirium currently which is probably related to his being so malnourished and dehydrated which is on top of his bipolar disorder or schizoaffective disorder. Not clear if he had been compliant with any medicines prior to admission. He's not hostile but he is trying to get out of bed and is easily confused and disoriented. Couldn't continue to understand where he was or who the nurse was. Not sleeping well  and not eating well. I noticed that he had been restarted on Seroquel. I'm going to increase the dose of that up to 400 mg. A few years ago he was taking as much as 800. Obviously he is more debilitated since then but 400 is still probably reasonable dose. I'm also going to restart the lithium. His kidneys are functioning well enough that a very low dose of 150 mg twice a day should be safe. Can continue the current antidepressants. We'll follow-up as needed. Also I will add a when necessary of Haldol which can be given IV if he gets agitated especially if he's having trouble sleeping or trying to get out of bed a lot.  Plan:  Patient does not meet criteria for psychiatric inpatient admission. Supportive therapy provided about ongoing stressors. Discussed crisis plan, support from social network, calling 911, coming to the Emergency Department, and calling Suicide Hotline. Disposition: Medication orders in place. Chart all reviewed. We'll follow-up as needed  Alethia Berthold 01/15/2015 7:21 PM

## 2015-01-15 NOTE — Care Management Note (Signed)
Case Management Note  Patient Details  Name: Vincent Andrade MRN: [id number] Date of Birth: 06-05-1936  Subjective/Objective:       Called Dr Bridgett Larsson to ask if he   Has seen pt., and will change admit ordder to [place]. He states he has not seen the pt. As he is still trying to discharge other pts.  Will continue to call.          Action/Plan:   Expected Discharge Date:                  Expected Discharge Plan:     In-House Referral:     Discharge planning Services     Post Acute Care Choice:    Choice offered to:     DME Arranged:    DME Agency:     HH Arranged:    [place] Agency:     Status of Service:     Medicare Important Message Given:    Date Medicare IM Given:    Medicare IM give by:    Date Additional Medicare IM Given:    Additional Medicare Important Message give by:     If discussed at [place] of Stay Meetings, dates discussed:    Additional Comments:  [name], RN 01/15/2015, 10:42 AM

## 2015-01-15 NOTE — Progress Notes (Signed)
[place] and spoke to [place]. She describes the pt. Has been violent with wife, so she lives in a separate home, but because pt. Decided he did not want to work up recent weight loss for fear of Cancer, she placed an IVC. Pt. Has stopped eating , in an effort to commit suicide. Has threatened to kill him self with spouse , and Education officer, museum involved. Spouse is applying for guardianship, and went to courthouse yesterday to start the process.  Tania Ade [phone]  Wife [phone]

## 2015-01-15 NOTE — Progress Notes (Signed)
Lawyer to see pt and discuss guardianship needs, pt will need to go to placement after discharge. Awaiting Dr. Weber Cooks to see pt, paged and stated he would see pt this eve.

## 2015-01-15 NOTE — Plan of Care (Signed)
Problem: Discharge Progression Outcomes Goal: Other Discharge Outcomes/Goals Outcome: Progressing Family at bedside, meeting done with CSW regarding guardian ship of pt. Pt is IVC and NPO, several consults ordered awaiting physician to see pt. Skilled nursing facility search in progress.

## 2015-01-15 NOTE — Plan of Care (Signed)
Problem: Discharge Progression Outcomes Goal: Discharge plan in place and appropriate Outcome: Progressing Patient is a High Fall Risk, states he has fallen multiple times Patient is from home alone, involuntary admit. Sitter at Bedside Admitted d/t inability to take care of self--not bathing, not taking medications, eating or drinking. Patient states he is separated from his wife. States he has no family support Patient is Catholic. Hx of asthma, bipolar, depression, dementia, schizophrenia, COPD IBS. No home medications listed, awaiting family.  He is a poor historian. Goal: Other Discharge Outcomes/Goals Outcome: Progressing Patient is alert to self and location. Standby assist to the bathroom. Remains NPO, IV fluids running. Patient appears malnourished and withdrawn.
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
## 2015-01-16 ENCOUNTER — Encounter: Payer: Self-pay | Admitting: Psychiatry

## 2015-01-16 DIAGNOSIS — F259 Schizoaffective disorder, unspecified: Secondary | ICD-10-CM | POA: Insufficient documentation

## 2015-01-16 DIAGNOSIS — F258 Other schizoaffective disorders: Secondary | ICD-10-CM

## 2015-01-16 MED ORDER — SODIUM CHLORIDE 0.9 % IJ SOLN
3.0000 mL | Freq: Three times a day (TID) | INTRAMUSCULAR | Status: DC
  Administered 2015-01-16 – 2015-01-27 (×17): 3 mL via INTRAVENOUS

## 2015-01-16 NOTE — Plan of Care (Signed)
Problem: Discharge Progression Outcomes Goal: Other Discharge Outcomes/Goals Outcome: Progressing Progress to Goals: More oriented, calm, cooperative with mild confusion. Up in room/BR with SB+. Took meds with encouragement. Sitter continued for IVC. Denies co's pain. Skin intact over right anterior chest lesion.

## 2015-01-16 NOTE — Consult Note (Signed)
[facility] Consult   Reason for Consult:   Follow up and help with management of mental illness Referring Physician:  Floor Physician on Oncology Patient Identification: Vincent Andrade MRN:  [id number] Principal Diagnosis: <principal problem not specified> Diagnosis:   Patient Active Problem List   Diagnosis Date Noted  . Schizo-affective schizophrenia, chronic condition [F25.8] 01/16/2015    Priority: Medium  . Lung cancer, primary, with metastasis from lung to other site [C34.90, C79.9] 01/15/2015  . Lung cancer [C34.90] 01/14/2015  . Depression [F32.9] 01/14/2015  . Bipolar disorder [F31.9] 01/14/2015  . COPD (chronic obstructive pulmonary disease) [J44.9] 01/14/2015  . Failure to thrive in adult [R62.7] 01/14/2015    Total Time spent with patient: 45 minutes  Subjective:   Vincent Andrade is a 79 y.o. male patient admitted with a long H/O mental illness ie SAD with psychosis nd depression and admitted for Lung cancer,.  HPI:  Pt has been living by himself  In  A house and is separated and is taking care of himself. Staff reports that he was agitated yesterday but since meds were started he has been much calmer and is co-operative and is doing better. HPI Elements:   As above meds are helping.  Past Medical History:  Past Medical History  Diagnosis Date  . Asthma   . Bipolar disorder   . Depression   . Dementia   . HLD (hyperlipidemia)   . IBS (irritable bowel syndrome)   . Schizophrenia   . COPD (chronic obstructive pulmonary disease)   . Panic attacks   . Lung cancer 01/14/2015    Past Surgical History  Procedure Laterality Date  . Appendectomy     Family History:  Family History  Problem Relation Age of Onset  . Bipolar disorder    . Cancer      grandmother   Social History:  History  Alcohol Use No     History  Drug Use No    History   Social History  . Marital Status: Single    Spouse Name: N/A  . Number of Children: N/A   . Years of Education: N/A   Social History Main Topics  . Smoking status: Former Research scientist (life sciences)  . Smokeless tobacco: Not on file  . Alcohol Use: No  . Drug Use: No  . Sexual Activity: Not on file   Other Topics Concern  . None   Social History Narrative   Additional Social History:                          Allergies:  No Known Allergies  Labs:  Results for orders placed or performed during the hospital encounter of 01/14/15 (from the past 48 hour(s))  CBC     Status: Abnormal   Collection Time: 01/15/15  1:28 AM  Result Value Ref Range   WBC 11.4 (H) 3.8 - 10.6 K/uL   RBC 3.60 (L) 4.40 - 5.90 MIL/uL   Hemoglobin 9.8 (L) 13.0 - 18.0 g/dL   HCT 30.3 (L) 40.0 - 52.0 %   MCV 84.3 80.0 - 100.0 fL   MCH 27.3 26.0 - 34.0 pg   MCHC 32.4 32.0 - 36.0 g/dL   RDW 14.0 11.5 - 14.5 %   Platelets 470 (H) 150 - 440 K/uL  Creatinine, serum     Status: None   Collection Time: 01/15/15  1:28 AM  Result Value Ref Range   Creatinine, Ser 1.02 0.61 - 1.24 mg/dL  GFR calc non Af Amer >60 >60 mL/min   GFR calc Af Amer >60 >60 mL/min    Comment: (NOTE) The eGFR has been calculated using the CKD EPI equation. This calculation has not been validated in all clinical situations. eGFR's persistently <60 mL/min signify possible Chronic Kidney Disease.   Basic metabolic panel     Status: Abnormal   Collection Time: 01/15/15  1:28 AM  Result Value Ref Range   Sodium 135 135 - 145 mmol/L   Potassium 4.1 3.5 - 5.1 mmol/L   Chloride 100 (L) 101 - 111 mmol/L   CO2 25 22 - 32 mmol/L   Glucose, Bld 102 (H) 65 - 99 mg/dL   BUN 21 (H) 6 - 20 mg/dL   Creatinine, Ser 1.04 0.61 - 1.24 mg/dL   Calcium 8.6 (L) 8.9 - 10.3 mg/dL   GFR calc non Af Amer >60 >60 mL/min   GFR calc Af Amer >60 >60 mL/min    Comment: (NOTE) The eGFR has been calculated using the CKD EPI equation. This calculation has not been validated in all clinical situations. eGFR's persistently <60 mL/min signify possible Chronic  Kidney Disease.    Anion gap 10 5 - 15  Magnesium     Status: None   Collection Time: 01/15/15  1:28 AM  Result Value Ref Range   Magnesium 1.9 1.7 - 2.4 mg/dL  Phosphorus     Status: None   Collection Time: 01/15/15  1:28 AM  Result Value Ref Range   Phosphorus 3.1 2.5 - 4.6 mg/dL    Vitals: Blood pressure 111/73, pulse 99, temperature 97.6 F (36.4 C), temperature source Oral, resp. rate 18, height '6\' 1"'$  (1.854 m), weight 59.421 kg (131 lb), SpO2 97 %.  Risk to Self: Is patient at risk for suicide?: No Risk to Others:   Prior Inpatient Therapy:   Prior Outpatient Therapy:    Current Facility-Administered Medications  Medication Dose Route Frequency Provider Last Rate Last Dose  . acetaminophen (TYLENOL) tablet 650 mg  650 mg Oral Q6H PRN Lance Coon, MD       Or  . acetaminophen (TYLENOL) suppository 650 mg  650 mg Rectal Q6H PRN Lance Coon, MD      . buPROPion [facility] SR) 12 hr tablet 150 mg  150 mg Oral BID Judith Part Mantzouris, NP   150 mg at 01/16/15 0955  . clonazePAM (KLONOPIN) tablet 0.5 mg  0.5 mg Oral BID PRN Judith Part Mantzouris, NP   0.5 mg at 01/15/15 1559  . enoxaparin (LOVENOX) injection 40 mg  40 mg Subcutaneous Q24H Lance Coon, MD   40 mg at 01/16/15 6237  . haloperidol lactate (HALDOL) injection 1 mg  1 mg Intravenous Q6H PRN Dustin Flock, MD      . haloperidol lactate (HALDOL) injection 1 mg  1 mg Intravenous Q2H PRN Gonzella Lex, MD      . levothyroxine (SYNTHROID, LEVOTHROID) tablet 25 mcg  25 mcg Oral QAC breakfast Judith Part Mantzouris, NP   25 mcg at 01/16/15 0955  . lithium citrate 300 MG/5ML solution 150 mg  150 mg Oral BID Demetrios Loll, MD   150 mg at 01/16/15 0956  . PARoxetine (PAXIL) tablet 40 mg  40 mg Oral Daily Judith Part Mantzouris, NP   40 mg at 01/16/15 0956  . QUEtiapine (SEROQUEL) tablet 400 mg  400 mg Oral QHS Gonzella Lex, MD   400 mg at 01/15/15 2046  . senna-docusate (Senokot-S) tablet 1 tablet  1 tablet Oral QHS PRN Lance Coon, MD      . simvastatin (ZOCOR) tablet 20 mg  20 mg Oral q1800 Judith Part Mantzouris, NP   20 mg at 01/16/15 0956  . sodium chloride 0.9 % injection 3 mL  3 mL Intravenous Q8H Epifanio Lesches, MD   3 mL at 01/16/15 1718  . traZODone (DESYREL) tablet 100 mg  100 mg Oral QHS Judith Part Mantzouris, NP   100 mg at 01/15/15 2046    Musculoskeletal: Strength & Muscle Tone: within normal limits Gait & Station: unsteady Patient leans: N/A  Psychiatric Specialty Exam: Physical Exam  Review of Systems  Constitutional: Negative.   HENT: Negative.   Eyes: Negative.   Respiratory: Negative.   Cardiovascular: Negative.   Gastrointestinal: Negative.   Musculoskeletal: Negative.   Skin: Negative.   Neurological: Negative.   Endo/Heme/Allergies: Negative.   Psychiatric/Behavioral: Positive for depression.    Blood pressure 111/73, pulse 99, temperature 97.6 F (36.4 C), temperature source Oral, resp. rate 18, height $RemoveBe'6\' 1"'IcsHBZobp$  (1.854 m), weight 59.421 kg (131 lb), SpO2 97 %.Body mass index is 17.29 kg/(m^2).  General Appearance: Casual  Eye Contact::  Fair  Speech:  Slow  Volume:  Normal  Mood:  Anxious  Affect:  Appropriate  Thought Process:  Circumstantial  Orientation:  Full (Time, Place, and Person)  Thought Content:  NA  Suicidal Thoughts:  No  Homicidal Thoughts:  No  Memory:  Recent;   Fair  Judgement:  Fair  Insight:  Fair  Psychomotor Activity:  Decreased  Concentration:  Fair  Recall:  [employer] of Knowledge:Fair  Language: Fair  Akathisia:  No  Handed:  Right  AIMS (if indicated):     Assets:  Communication Skills Desire for Improvement Social Support Transportation  ADL's:  Impaired and needs help from staff  Cognition: WNL with help and prompting.  Sleep:      Medical Decision Making: Established Problem, Stable/Improving (1), Review of Psycho-Social Stressors (1), Independent Review of image, tracing or specimen (2) and Review of Medication Regimen & Side  Effects (2)  Treatment Plan Summary: Daily contact with patient to assess and evaluate symptoms and progress in treatment, Medication management and REcommend continue current meds as pt is showing improvement and is not agitated as he was at the time of admiision and is co-operative with the sitter in taking care of ADL's and meds  Plan:  No evidence of imminent risk to self or others at present.   Patient does not meet criteria for psychiatric inpatient admission. Supportive therapy provided about ongoing stressors. continue current psychotropic meds Disposition: Continue current meds and follow up with his Mental health Dr after discharge and follow up with D/C IVC at time of discharge  Dewain Penning 01/16/2015 7:17 PM

## 2015-01-16 NOTE — Plan of Care (Signed)
Problem: Discharge Progression Outcomes Goal: Other Discharge Outcomes/Goals Outcome: Progressing Plan of care progress to goal:  Patient is alert to self and location.Sitter remains at bedside per md order,   Patient remains mal nourished continue to monitor

## 2015-01-16 NOTE — Progress Notes (Signed)
[facility] [place] NAME: Vincent Andrade    MR#:  [id number]  DATE OF BIRTH:  [birth year]  SUBJECTIVE: Under IVC. He is oriented to time place percent. His  Po  intake is better today. Denies any complaints.   CHIEF COMPLAINT:   Chief Complaint  Patient presents with  . Failure To Thrive   Denies any symptoms, not reliable.  DRUG ALLERGIES:  No Known Allergies  VITALS:  Blood pressure 112/78, pulse 81, temperature 98.1 F (36.7 C), temperature source Oral, resp. rate 18, height '6\' 1"'$  (1.854 m), weight 59.421 kg (131 lb), SpO2 97 %.  PHYSICAL EXAMINATION:  GENERAL:  79 y.o.-year-old patient lying in the bed with no acute distress. Thin. EYES:  No scleral icterus. Extraocular muscles intact.  HEENT: Head atraumatic, normocephalic. Oropharynx and nasopharynx clear.  NECK:  Supple, no jugular venous distention. No thyroid enlargement, no tenderness.  LUNGS: Normal breath sounds bilaterally, mild wheezing, no rales,rhonchi or crepitation. No use of accessory muscles of respiration.  CARDIOVASCULAR: S1, S2 normal. No murmurs, rubs, or gallops.  ABDOMEN: Soft, nontender, nondistended. Bowel sounds present. No organomegaly or mass.  EXTREMITIES: No pedal edema, cyanosis, or clubbing.  NEUROLOGIC: Cranial nerves II through XII are intact. Muscle strength 5/5 in all extremities. Sensation intact. Gait not checked.  PSYCHIATRIC: The patient is alert and oriented x 3.  SKIN: No obvious rash, lesion, or ulcer.    LABORATORY PANEL:   CBC  Recent Labs Lab 01/15/15 0128  WBC 11.4*  HGB 9.8*  HCT 30.3*  PLT 470*   ------------------------------------------------------------------------------------------------------------------  Chemistries   Recent Labs Lab 01/14/15 1837 01/15/15 0128  NA 136 135  K 4.6 4.1  CL 100* 100*  CO2 28 25  GLUCOSE 137* 102*  BUN 24* 21*  CREATININE 1.04 1.04  1.02  CALCIUM 8.6* 8.6*  MG   --  1.9  AST 40  --   ALT 46  --   ALKPHOS 199*  --   BILITOT 1.7*  --    ------------------------------------------------------------------------------------------------------------------  Cardiac Enzymes  Recent Labs Lab 01/14/15 1837  TROPONINI <0.03   ------------------------------------------------------------------------------------------------------------------  RADIOLOGY:  Ct Head Wo Contrast  01/14/2015   CLINICAL DATA:  Weight loss with loss of appetite. No history of malignancy. Initial encounter.  EXAM: CT HEAD WITHOUT CONTRAST  TECHNIQUE: Contiguous axial images were obtained from the base of the skull through the vertex without intravenous contrast.  COMPARISON:  None.  FINDINGS: There is no evidence of acute intracranial hemorrhage, mass lesion, brain edema or extra-axial fluid collection. The ventricles and subarachnoid spaces are diffusely prominent consistent with mild atrophy for age. There is no evidence of cortical stroke. Mild intracranial vascular calcifications are noted.  The visualized paranasal sinuses, mastoid air cells and middle ears are clear. The calvarium is intact.  IMPRESSION: Mild generalized atrophy.  No acute intracranial findings.   Electronically Signed   By: Richardean Sale M.D.   On: 01/14/2015 20:30   Ct Chest W Contrast  01/14/2015   CLINICAL DATA:  Altered mental status with weight loss and loss of appetite. No history of malignancy. Previous cholecystectomy and appendectomy. Initial encounter.  EXAM: CT CHEST, ABDOMEN, AND PELVIS WITH CONTRAST  TECHNIQUE: Multidetector CT imaging of the chest, abdomen and pelvis was performed following the standard protocol during bolus administration of intravenous contrast.  CONTRAST:  162m OMNIPAQUE IOHEXOL 300 MG/ML  SOLN  COMPARISON:  Abdominal pelvic CT 08/06/2007. Chest radiographs 08/15/2007.  FINDINGS:  CT CHEST FINDINGS  Mediastinum/Nodes: There are enlarged mediastinal and right hilar lymph nodes. There  is a right paratracheal node measuring 18 mm on image 20 and a 13 mm right paratracheal node on image 23. There is a 13 mm right hilar node on image 26. The thyroid gland, trachea and esophagus demonstrate no significant findings. The heart size is normal. There is no pericardial effusion.There is atherosclerosis of the aorta, great vessels and coronary arteries.  Lungs/Pleura: There is no pleural effusion.Advanced emphysematous changes are present. There is a right apical mass with chest wall destruction and surrounding parenchymal opacity. The mass measures up to 5.6 x 3.3 cm transverse on axial image 9. There is erosion and partial destruction of the right third rib posteriorly. No other pulmonary nodules identified.  Musculoskeletal/Chest wall: As above, right apical mass invades the chest wall and partially destroys the right third rib. No distant osseous metastases identified. 4.2 cm probable sebaceous cyst in the right anterior chest wall is unchanged.  CT ABDOMEN AND PELVIS FINDINGS  Hepatobiliary: The liver is normal in density without focal abnormality. No biliary dilatation status post cholecystectomy.  Pancreas: Unremarkable. No pancreatic ductal dilatation or surrounding inflammatory changes.  Spleen: Normal in size without focal abnormality.  Adrenals/Urinary Tract: Both adrenal glands appear normal.Both kidneys demonstrate mild cortical thinning. There are tiny low-density renal lesions bilaterally, likely cysts. No evidence of enhancing mass or urinary tract calculus. No hydronephrosis. No significant bladder findings.  Stomach/Bowel: No evidence of bowel wall thickening, distention or surrounding inflammatory change.  Vascular/Lymphatic: There are no enlarged abdominal or pelvic lymph nodes. Moderate aortoiliac atherosclerosis.  Reproductive: Unremarkable.  Other: No evidence of abdominal wall mass or hernia.  Musculoskeletal: No acute or significant osseous findings.  IMPRESSION: 1. Right apical  lung mass with chest wall invasion, partial destruction of the right third rib and mediastinal adenopathy consistent with bronchogenic carcinoma. 2. Adjacent parenchymal opacities appear partially chronic in correlation with prior radiographs. There may be a component of adjacent inflammation as well. 3. No evidence of metastatic disease within the abdomen or pelvis. 4. Moderate atherosclerosis. 5. Advanced emphysema.   Electronically Signed   By: Richardean Sale M.D.   On: 01/14/2015 20:41   Ct Abdomen Pelvis W Contrast  01/14/2015   CLINICAL DATA:  Altered mental status with weight loss and loss of appetite. No history of malignancy. Previous cholecystectomy and appendectomy. Initial encounter.  EXAM: CT CHEST, ABDOMEN, AND PELVIS WITH CONTRAST  TECHNIQUE: Multidetector CT imaging of the chest, abdomen and pelvis was performed following the standard protocol during bolus administration of intravenous contrast.  CONTRAST:  151m OMNIPAQUE IOHEXOL 300 MG/ML  SOLN  COMPARISON:  Abdominal pelvic CT 08/06/2007. Chest radiographs 08/15/2007.  FINDINGS: CT CHEST FINDINGS  Mediastinum/Nodes: There are enlarged mediastinal and right hilar lymph nodes. There is a right paratracheal node measuring 18 mm on image 20 and a 13 mm right paratracheal node on image 23. There is a 13 mm right hilar node on image 26. The thyroid gland, trachea and esophagus demonstrate no significant findings. The heart size is normal. There is no pericardial effusion.There is atherosclerosis of the aorta, great vessels and coronary arteries.  Lungs/Pleura: There is no pleural effusion.Advanced emphysematous changes are present. There is a right apical mass with chest wall destruction and surrounding parenchymal opacity. The mass measures up to 5.6 x 3.3 cm transverse on axial image 9. There is erosion and partial destruction of the right third rib posteriorly. No other pulmonary nodules identified.  Musculoskeletal/Chest wall: As above, right  apical mass invades the chest wall and partially destroys the right third rib. No distant osseous metastases identified. 4.2 cm probable sebaceous cyst in the right anterior chest wall is unchanged.  CT ABDOMEN AND PELVIS FINDINGS  Hepatobiliary: The liver is normal in density without focal abnormality. No biliary dilatation status post cholecystectomy.  Pancreas: Unremarkable. No pancreatic ductal dilatation or surrounding inflammatory changes.  Spleen: Normal in size without focal abnormality.  Adrenals/Urinary Tract: Both adrenal glands appear normal.Both kidneys demonstrate mild cortical thinning. There are tiny low-density renal lesions bilaterally, likely cysts. No evidence of enhancing mass or urinary tract calculus. No hydronephrosis. No significant bladder findings.  Stomach/Bowel: No evidence of bowel wall thickening, distention or surrounding inflammatory change.  Vascular/Lymphatic: There are no enlarged abdominal or pelvic lymph nodes. Moderate aortoiliac atherosclerosis.  Reproductive: Unremarkable.  Other: No evidence of abdominal wall mass or hernia.  Musculoskeletal: No acute or significant osseous findings.  IMPRESSION: 1. Right apical lung mass with chest wall invasion, partial destruction of the right third rib and mediastinal adenopathy consistent with bronchogenic carcinoma. 2. Adjacent parenchymal opacities appear partially chronic in correlation with prior radiographs. There may be a component of adjacent inflammation as well. 3. No evidence of metastatic disease within the abdomen or pelvis. 4. Moderate atherosclerosis. 5. Advanced emphysema.   Electronically Signed   By: Richardean Sale M.D.   On: 01/14/2015 20:41    EKG:   Orders placed or performed during the hospital encounter of 01/14/15  . ED EKG  . ED EKG  . EKG 12-Lead  . 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 Palliative care staff d/w pt's FM, now on DNR. F/u hospice screen. Patient  and the family do not want treatment for lung cancer. Family agrees for hospice services after this IVC. Code status DO NOT RESUSCITATE.   Failure to thrive in adult -malnutrition;continue regular diet,nutritional supplements.  Depression - f/u psychiatry consult, on IVC.Marland Kitchen Continue lithium, Paxil, Seroquel. Monitor closely, psych follow-up regarding IVC.  Bipolar disorder -psychiatry consult as above.   COPD (chronic obstructive pulmonary disease) -stable.   D/w pt's wife and daughter. All the records are reviewed and case discussed with Care Management/Social Workerr. Management plans discussed with the patient, family and they are in agreement.  CODE STATUS: DNR TOTAL TIME TAKING CARE OF THIS PATIENT: 46  minutes.   POSSIBLE D/C IN 2 AYS, DEPENDING ON CLINICAL CONDITION.   Epifanio Lesches M.D on 01/16/2015 at 11:34 AM  Between 7am to 6pm - Pager - [phone]  After 6pm go to [email or web address] - password [email or web address] [facility]  [facility]  Office  [phone]  CC: Primary care physician; No primary care provider on file.

## 2015-01-17 MED ORDER — BRIMONIDINE TARTRATE 0.1 % OP SOLN
1.0000 [drp] | Freq: Three times a day (TID) | OPHTHALMIC | Status: DC
  Administered 2015-01-17 – 2015-01-27 (×29): 1 [drp] via OPHTHALMIC
  Filled 2015-01-17: qty 10

## 2015-01-17 MED ORDER — NON FORMULARY: 1.0000 [drp] | Freq: Three times a day (TID) | Status: DC

## 2015-01-17 NOTE — Progress Notes (Signed)
[facility] at [place] NAME: Vincent Andrade    MR#:  [id number]  DATE OF BIRTH:  17-Oct-1935  SUBJECTIVE: Under IVC. He is oriented to time place percent. Refused to eat any last night.   CHIEF COMPLAINT:   Chief Complaint  Patient presents with  . Failure To Thrive   Denies any symptoms, not reliable.  DRUG ALLERGIES:  No Known Allergies  VITALS:  Blood pressure 147/124, pulse 80, temperature 98.3 F (36.8 C), temperature source Oral, resp. rate 16, height '6\' 1"'$  (1.854 m), weight 59.421 kg (131 lb), SpO2 98 %.  PHYSICAL EXAMINATION:  GENERAL:  79 y.o.-year-old patient lying in the bed with no acute distress. Thin. EYES:  No scleral icterus. Extraocular muscles intact.  HEENT: Head atraumatic, normocephalic. Oropharynx and nasopharynx clear.  NECK:  Supple, no jugular venous distention. No thyroid enlargement, no tenderness.  LUNGS: Normal breath sounds bilaterally, mild wheezing, no rales,rhonchi or crepitation. No use of accessory muscles of respiration.  CARDIOVASCULAR: S1, S2 normal. No murmurs, rubs, or gallops.  ABDOMEN: Soft, nontender, nondistended. Bowel sounds present. No organomegaly or mass.  EXTREMITIES: No pedal edema, cyanosis, or clubbing.  NEUROLOGIC: Cranial nerves II through XII are intact. Muscle strength 5/5 in all extremities. Sensation intact. Gait not checked.  PSYCHIATRIC: The patient is alert and oriented x 3.  SKIN: No obvious rash, lesion, or ulcer.    LABORATORY PANEL:   CBC  Recent Labs Lab 01/15/15 0128  WBC 11.4*  HGB 9.8*  HCT 30.3*  PLT 470*   ------------------------------------------------------------------------------------------------------------------  Chemistries   Recent Labs Lab 01/14/15 1837 01/15/15 0128  NA 136 135  K 4.6 4.1  CL 100* 100*  CO2 28 25  GLUCOSE 137* 102*  BUN 24* 21*  CREATININE 1.04 1.04  1.02  CALCIUM 8.6* 8.6*  MG  --  1.9  AST 40  --    ALT 46  --   ALKPHOS 199*  --   BILITOT 1.7*  --    ------------------------------------------------------------------------------------------------------------------  Cardiac Enzymes  Recent Labs Lab 01/14/15 1837  TROPONINI <0.03   ------------------------------------------------------------------------------------------------------------------  RADIOLOGY:  No results found.  EKG:   Orders placed or performed during the hospital encounter of 01/14/15  . ED EKG  . ED EKG  . EKG 12-Lead  . EKG 12-Lead    ASSESSMENT AND PLAN:  Admitted for  Not able to manage at home,did not change clothes and took shower for week and not eating.found to have  Large lung mass/ Lung cancer with meta to chest walls. Palliative care staff d/w pt's FM, now on DNR. F/u hospice screen. Patient and the family do not want treatment for lung cancer. Family agrees for hospice services after this IVC. Code status DO NOT RESUSCITATE.   Failure to thrive in adult -malnutrition;continue regular diet,nutritional supplements.  Depression - f/u psychiatry consult, on IVC.Marland Kitchen Continue lithium, Paxil, Seroquel. Monitor closely, psych follow-up regarding IVC.  Bipolar disorder -psychiatry consult as above.   COPD;stable  Called daughter [name] at [phone] voice mail to call back at [phone]. All the records are reviewed and case discussed with Care Management/Social Workerr. Management plans discussed with the patient, family and they are in agreement.  CODE STATUS: DNR TOTAL TIME TAKING CARE OF THIS PATIENT: 46  minutes.   POSSIBLE D/C IN 2 AYS, DEPENDING ON CLINICAL CONDITION.   Epifanio Lesches M.D on 01/17/2015 at 8:29 AM  Between 7am to 6pm - Pager - [phone]  After 6pm go  to [email or web address] - password [email or web address] [facility]  [facility]  Office  [phone]  CC: Primary care physician; No primary care provider on file.

## 2015-01-17 NOTE — Progress Notes (Signed)
   01/17/15 1550  Clinical Encounter Type  Visited With Patient and family together  Visit Type Initial  Referral From Nurse  Consult/Referral To Chaplain  Spiritual Encounters  Spiritual Needs Ritual  Stress Factors  Patient Stress Factors Health changes  Family Stress Factors Health changes;Other (Comment)  Chaplain received an order that patient wanted to speak with a priest. I went in to visit with patient and family and we had a lengthy discussion about health and circumstances. Provided a compassionate presence. Patient said he wanted to confess. Chaplain Selassie Spatafore A. Rylyn Ranganathan [phone]

## 2015-01-17 NOTE — Plan of Care (Signed)
Problem: Discharge Progression Outcomes Goal: Discharge plan in place and appropriate Outcome: Progressing Pt likes to be called nick, currentlty IVC for safety  Goal: Other Discharge Outcomes/Goals Outcome: Progressing Plan of care progress to goal:  Discharge: per md notes plan is  for hospice care at discharge  Pain : pt with no complaints of pain  Hemo stable: VSS continue to monitor labs  Complications: per daughter carol there are issues with visitors and her mother not understanding what it means that they want hospice involved in care, daughter to speak to md today  Diet: appetite has improved, states that he is eating better

## 2015-01-17 NOTE — Plan of Care (Signed)
Problem: Discharge Progression Outcomes Goal: Other Discharge Outcomes/Goals Outcome: Progressing 1. Discharge plans in progress. Dgt from [place] coming to see dad. Wife has not lived with pt for 8 years and has limited knowledge of pt history. 2. Denies pain. 3.VSS stable with no interventions needed today. 4. Tolerating diet with limited appetite; needs assistance with feeding; edentulous diet needs. 5. Unsteady on feet requiring 1+/ IVC sitter required for pt safety with pt confusion continued/very DOE limiting activity. Used BSC today instead of going to [facility].

## 2015-01-18 DIAGNOSIS — F3162 Bipolar disorder, current episode mixed, moderate: Secondary | ICD-10-CM | POA: Insufficient documentation

## 2015-01-18 MED ORDER — ALPRAZOLAM 0.25 MG PO TABS
0.2500 mg | ORAL_TABLET | ORAL | Status: DC | PRN
  Administered 2015-01-24 – 2015-01-27 (×5): 0.25 mg via ORAL
  Filled 2015-01-18 (×5): qty 1

## 2015-01-18 MED ORDER — ENSURE ENLIVE PO LIQD
237.0000 mL | Freq: Three times a day (TID) | ORAL | Status: DC
  Administered 2015-01-18 – 2015-01-21 (×7): 237 mL via ORAL

## 2015-01-18 NOTE — Consult Note (Signed)
  Psychiatry: Follow-up for this 79 year old man with a history of bipolar disorder now with lung cancer. Patient had no new complaints.  Review of systems he denies any physical pain. Says that he has been sleeping well. He understands that he is having a hard time getting up and standing and he remains very weak. Patient understands his diagnosis and the plan and prognosis and seems to be accepting of that.  On mental status this is a disheveled acutely and chronically ill looking gentleman. He was very pleasant talk with. Made good eye contact. Psychomotor activity obviously limited. Speech quiet decreased in amount affect mildly anxious but reactive. Denies suicidal ideation and does not appear to be having hallucinations. Some of his speech tends to ramble on topic but usually he can follow with he's not seeming to be bizarre. Nursing also reports that he's been calm throughout the day and sleeping well at night.  Patient appears to be mentally stable. No indication to change medicine. Continue Seroquel packs old Wellbutrin and lithium. I will follow up as needed. The patient himself mentioned to me that he has "claustrophobia". Nursing mentions that he gets very anxious when the door shut. I will go ahead and make sure that we have an order in place for some kind of when necessary antianxiety medicine but it seems like this is easily controlled through the environment.

## 2015-01-18 NOTE — Plan of Care (Signed)
Problem: Discharge Progression Outcomes Goal: Discharge plan in place and appropriate Individualization: Patient is a High Fall Risk, states he has fallen multiple times Patient is from home alone, involuntary admit. Sitter at Bedside Admitted d/t inability to take care of self--not bathing, not taking medications, eating or drinking. Patient states he is separated from his wife. States he has no family support Patient is Catholic.  Hx of asthma, bipolar, depression, dementia, schizophrenia, COPD IBS. No home medications listed, awaiting family.  He is a poor historian. Goal: Other Discharge Outcomes/Goals Outcome: Progressing Plan of care progress to goal:  Pt remains confused. Dietician consulted. Pt remains IVC, sitter at the bedside. On O2 2L [place]. No c/o pain. Family at the bedside. Talked to Dr. Grayland Ormond and Dr. Vianne Bulls about updating family of plan of care.

## 2015-01-18 NOTE — Progress Notes (Addendum)
Nutrition Follow-up  DOCUMENTATION CODES:  Non-severe (moderate) malnutrition in context of chronic illness  INTERVENTION:  Meals and Snacks: Cater to patient preferences Medical Food Supplement Therapy: will continue Ensure Enlive po BID, each supplement provides 350 kcal and 20 grams of protein, will send Magic Cup   NUTRITION DIAGNOSIS:  Inadequate oral intake related to acute illness as evidenced by meal completion < 25%, ongoing  GOAL:  Patient will meet greater than or equal to 90% of their needs  MONITOR:  Energy Intake Digestive system Anthropometrics Electrolyte and Renal Profile  REASON FOR ASSESSMENT:  Consult Assessment of nutrition requirement/status  ASSESSMENT:  Pt pleasant but confused at times on visit.  PO Intake: pt reports 'I don't eat at home' on visit however pt unable to clarify length of time that pt has not been eating. Pt ate ice cream and soup for lunch with one and a half chocolate milks per sitter and ate bites this am of breakfast. Per I and O chart pt ate average of 75% of meals yesterday.  Medications:reviewed Labs: Electrolyte and Renal Profile:    Recent Labs Lab 01/14/15 1837 01/15/15 0128  BUN 24* 21*  CREATININE 1.04 1.04  1.02  NA 136 135  K 4.6 4.1  MG  --  1.9  PHOS  --  3.1   Glucose Profile: No results for input(s): GLUCAP in the last 72 hours. Protein Profile:  Recent Labs Lab 01/14/15 1837  ALBUMIN 2.2*   Pt reports UBW of 208lbs unable to clarify how long ago that was.  Height:  Ht Readings from Last 1 Encounters:  01/14/15 '6\' 1"'$  (1.854 m)    Weight:  Wt Readings from Last 1 Encounters:  01/14/15 131 lb (59.421 kg)    Wt Readings from Last 10 Encounters:  01/14/15 131 lb (59.421 kg)   Nutrition-Focused physical exam completed. Findings are mild/moderate fat depletion, severe muscle depletion, and no edema.    BMI:  Body mass index is 17.29 kg/(m^2).  Estimated Nutritional Needs:  Kcal:   1765-2086kcals, BEE: 1338kcals, TEE: (IF 1.1-1.3)(AF 1.2)   Protein:  59-71g protein (1.0-1.2g/kg)  Fluid:  1475-1744m of fluid (25-383mkg)  Skin:     Diet Order:  DIET SOFT Room service appropriate?: Yes; Fluid consistency:: Thin  EDUCATION NEEDS:  No education needs identified at this time   Intake/Output Summary (Last 24 hours) at 01/18/15 1513 Last data filed at 01/18/15 1300  Gross per 24 hour  Intake    480 ml  Output    775 ml  Net   -295 ml    Last BM:  5/13  HIGH Care Level  AlDwyane LuoRD, LDN Pager [phone]

## 2015-01-18 NOTE — Progress Notes (Signed)
[place] at [place] NAME: Vincent Andrade    MR#:  [id number]  DATE OF BIRTH:  1936-03-16  SUBJECTIVE; continue to have poor by mouth intake. Patient gets agitated if we tried to encourage him to eat. Patient says that he wants to go home and rest peacefully.   CHIEF COMPLAINT:   Chief Complaint  Patient presents with  . Failure To Thrive   Denies any symptoms, not reliable.  DRUG ALLERGIES:  No Known Allergies  VITALS:  Blood pressure 93/68, pulse 80, temperature 97.6 F (36.4 C), temperature source Oral, resp. rate 18, height '6\' 1"'$  (1.854 m), weight 59.421 kg (131 lb), SpO2 100 %.  PHYSICAL EXAMINATION:  GENERAL:  79 y.o.-year-old patient lying in the bed with no acute distress. Thin. EYES:  No scleral icterus. Extraocular muscles intact.  HEENT: Head atraumatic, normocephalic. Oropharynx and nasopharynx clear.  NECK:  Supple, no jugular venous distention. No thyroid enlargement, no tenderness.  LUNGS: Normal breath sounds bilaterally, mild wheezing, no rales,rhonchi or crepitation. No use of accessory muscles of respiration.  CARDIOVASCULAR: S1, S2 normal. No murmurs, rubs, or gallops.  ABDOMEN: Soft, nontender, nondistended. Bowel sounds present. No organomegaly or mass.  EXTREMITIES: No pedal edema, cyanosis, or clubbing.  NEUROLOGIC: Cranial nerves II through XII are intact. Muscle strength 5/5 in all extremities. Sensation intact. Gait not checked.  PSYCHIATRIC: The patient is alert and oriented x 3.  SKIN: No obvious rash, lesion, or ulcer.    LABORATORY PANEL:   CBC  Recent Labs Lab 01/15/15 0128  WBC 11.4*  HGB 9.8*  HCT 30.3*  PLT 470*   ------------------------------------------------------------------------------------------------------------------  Chemistries   Recent Labs Lab 01/14/15 1837 01/15/15 0128  NA 136 135  K 4.6 4.1  CL 100* 100*  CO2 28 25  GLUCOSE 137* 102*  BUN 24* 21*   CREATININE 1.04 1.04  1.02  CALCIUM 8.6* 8.6*  MG  --  1.9  AST 40  --   ALT 46  --   ALKPHOS 199*  --   BILITOT 1.7*  --    ------------------------------------------------------------------------------------------------------------------  Cardiac Enzymes  Recent Labs Lab 01/14/15 1837  TROPONINI <0.03   ------------------------------------------------------------------------------------------------------------------  RADIOLOGY:  No results found.  EKG:   Orders placed or performed during the hospital encounter of 01/14/15  . ED EKG  . ED EKG  . EKG 12-Lead  . EKG 12-Lead    ASSESSMENT AND PLAN:  Admitted for  Not able to manage at home,did not change clothes and took shower for week and not eating.found to have  Large lung mass/ Lung cancer with meta to chest walls. Palliative care staff d/w pt's FM, now on DNR. F/u hospice screen. Patient and the family do not want treatment for lung cancer. Family agrees for hospice services after this IVC. Code status DO NOT RESUSCITATE.   Failure to thrive in adult -malnutrition;continue regular diet,nutritional supplements.  Depression - f/u psychiatry consult, on IVC.Marland Kitchen Continue lithium, Paxil, Seroquel. Monitor closely, psych follow-up regarding IVC.  Bipolar disorder -psychiatry consult as above.   COPD;stable Called and spoke to daughter Ronaldo Miyamoto with her,she wants to talk to DR.Finnegan for treatment options for his lung ca, she is coming from [place] on Wednesday. All the records are reviewed and case discussed with Care Management/Social Workerr. Management plans discussed with the patient, family and they are in agreement.  CODE STATUS: DNR TOTAL TIME TAKING CARE OF THIS PATIENT: 46  minutes.   POSSIBLE D/C  IN 2 AYS, DEPENDING ON CLINICAL CONDITION.   Epifanio Lesches M.D on 01/18/2015 at 12:55 PM  Between 7am to 6pm - Pager - [phone]  After 6pm go to [email or web address] - password [email or web address]  [facility]  Office  [phone]  CC: Primary care physician; No primary care provider on file.

## 2015-01-18 NOTE — Plan of Care (Signed)
Problem: Discharge Progression Outcomes Goal: Discharge plan in place and appropriate Outcome: Progressing   Pt likes to be called nick, currentlty IVC for safety      Goal: Other Discharge Outcomes/Goals Outcome: Progressing Plan of care progress to goal:   Discharge: per md notes plan is  for hospice care at discharge   Pain : pt with no complaints of pain   Hemo stable: VSS continue to monitor labs   Complications: patient remains confused, IVC sitter 1:1  Diet: appetite has improved needs to be fed

## 2015-01-18 NOTE — Progress Notes (Signed)
Spoke to Dr. Grayland Ormond to update daughter, [name], in [place]. He said he will call her.

## 2015-01-19 DIAGNOSIS — E440 Moderate protein-calorie malnutrition: Secondary | ICD-10-CM | POA: Insufficient documentation

## 2015-01-19 LAB — CREATININE, SERUM
Creatinine, Ser: 0.97 mg/dL (ref 0.61–1.24)
GFR calc non Af Amer: >60 mL/min (ref >60)

## 2015-01-19 MED ORDER — QUETIAPINE FUMARATE 25 MG PO TABS
50.0000 mg | ORAL_TABLET | Freq: Two times a day (BID) | ORAL | Status: DC
  Administered 2015-01-19 – 2015-01-21 (×4): 50 mg via ORAL
  Filled 2015-01-19 (×5): qty 2

## 2015-01-19 NOTE — Plan of Care (Signed)
Problem: Discharge Progression Outcomes Goal: Discharge plan in place and appropriate Individualization: Patient is a High Fall Risk, states he has fallen multiple times Patient is from home alone, involuntary admit. Sitter at Bedside Admitted d/t inability to take care of self--not bathing, not taking medications, eating or drinking. Patient states he is separated from his wife. States he has no family support Patient is Catholic.

## 2015-01-19 NOTE — Progress Notes (Signed)
CSW met with patient today who tells me he lives alone and has a neighbor/friend, Sonia Side, who is a source of support for him at home. "jerry and his wife and daughter live beside me". He reports he is married- since the age of 40- he reports his life left him "but now wants to come back". He was able to tell me his age and also told me he had been married for 2 years (which would be right if he got married at the age of 12 and if they are still legally married). Will attempt to confirm the information shared by patient. CSW spoke with patient about concerns related to his falls and if he is safe to return home alone. Patient would benefit from a PT eval given his multiple falls- CSW will ask MD to order when/if appropriate.   Eduard Clos, MSW, Latanya Presser

## 2015-01-19 NOTE — Progress Notes (Signed)
[place] at [place] NAME: Cliffard Hair    MR#:  [id number]  DATE OF BIRTH:  1936/05/25  SUBJECTIVE;  Seen today, patient denies any complaints. Patient was dyspneic on exertion start on 2 L of oxygen. Not having obvious respiratory distress. No chest pain. Does have some chronic cough. By mouth intake continues to be poor.  CHIEF COMPLAINT:   Chief Complaint  Patient presents with  . Failure To Thrive   Denies any symptoms, not reliable.  DRUG ALLERGIES:  No Known Allergies  VITALS:  Blood pressure 108/64, pulse 82, temperature 98.4 F (36.9 C), temperature source Oral, resp. rate 20, height '6\' 1"'$  (1.854 m), weight 59.421 kg (131 lb), SpO2 99 %.  PHYSICAL EXAMINATION:  GENERAL:  79 y.o.-year-old patient lying in the bed with no acute distress. Thin. EYES:  No scleral icterus. Extraocular muscles intact.  HEENT: Head atraumatic, normocephalic. Oropharynx and nasopharynx clear.  NECK:  Supple, no jugular venous distention. No thyroid enlargement, no tenderness.  LUNGS: Normal breath sounds bilaterally, mild wheezing, no rales,rhonchi or crepitation. No use of accessory muscles of respiration.  CARDIOVASCULAR: S1, S2 normal. No murmurs, rubs, or gallops.  ABDOMEN: Soft, nontender, nondistended. Bowel sounds present. No organomegaly or mass.  EXTREMITIES: No pedal edema, cyanosis, or clubbing.  NEUROLOGIC: Cranial nerves II through XII are intact. Muscle strength 5/5 in all extremities. Sensation intact. Gait not checked.  PSYCHIATRIC: The patient is alert and oriented x 3.  SKIN: No obvious rash, lesion, or ulcer.    LABORATORY PANEL:   CBC  Recent Labs Lab 01/15/15 0128  WBC 11.4*  HGB 9.8*  HCT 30.3*  PLT 470*   ------------------------------------------------------------------------------------------------------------------  Chemistries   Recent Labs Lab 01/14/15 1837 01/15/15 0128 01/19/15 0533  NA 136  135  --   K 4.6 4.1  --   CL 100* 100*  --   CO2 28 25  --   GLUCOSE 137* 102*  --   BUN 24* 21*  --   CREATININE 1.04 1.04  1.02 0.97  CALCIUM 8.6* 8.6*  --   MG  --  1.9  --   AST 40  --   --   ALT 46  --   --   ALKPHOS 199*  --   --   BILITOT 1.7*  --   --    ------------------------------------------------------------------------------------------------------------------  Cardiac Enzymes  Recent Labs Lab 01/14/15 1837  TROPONINI <0.03   ------------------------------------------------------------------------------------------------------------------  RADIOLOGY:  No results found.  EKG:   Orders placed or performed during the hospital encounter of 01/14/15  . ED EKG  . ED EKG  . EKG 12-Lead  . EKG 12-Lead    ASSESSMENT AND PLAN:  Admitted for  Not able to manage at home,did not change clothes and took shower for week and not eating.found to have  Large lung mass/ Lung cancer with meta to chest walls. Palliative care staff d/w pt's FM, now on DNR.  Daughter [name] requested to speak to Dr. Grayland Ormond regarding treatment options.  Failure to thrive in adult -malnutrition;continue regular diet,nutritional supplements. Patient is very deconditioned continued to have poor by mouth intake. Chronically looking.  Depression - f/u psychiatry consult, on IVC.Marland Kitchen Continue lithium, Paxil, Seroquel. Monitor closely, psych follow-up regarding IVC.  Bipolar disorder -psychiatry consult as above.   COPD;stable, no wheezing but requiring 2 L of oxygen continue it. Called and spoke to daughter Ronaldo Miyamoto with her,she wants to talk to DR.Finnegan for treatment options  for his lung ca, she is coming from [place] on Wednesday. All the records are reviewed and case discussed with Care Management/Social Workerr. Management plans discussed with the patient, family and they are in agreement.  CODE STATUS: DNR TOTAL TIME TAKING CARE OF THIS PATIENT: 46  minutes.   POSSIBLE D/C IN 2 AYS,  DEPENDING ON CLINICAL CONDITION.   Epifanio Lesches M.D on 01/19/2015 at 11:55 AM  Between 7am to 6pm - Pager - [phone]  After 6pm go to [email or web address] - password [email or web address] [facility]  [facility]  Office  [phone]  CC: Primary care physician; No primary care provider on file.

## 2015-01-19 NOTE — Plan of Care (Signed)
Problem: Discharge Progression Outcomes Goal: Discharge plan in place and appropriate Individualization: Outcome: Progressing Individualization: Patient is a High Fall Risk, states he has fallen multiple times Patient is from home alone, involuntary admit. Sitter at Bedside Admitted d/t inability to take care of self--not bathing, not taking medications, eating or drinking. Patient states he is separated from his wife. States he has no family support Patient is Catholic. Goal: Other Discharge Outcomes/Goals Outcome: Not Progressing Progression to goal: Pt remains confused to time, place and situation. Follows simple commands, resting quietly.

## 2015-01-19 NOTE — Plan of Care (Signed)
Problem: Discharge Progression Outcomes Goal: Other Discharge Outcomes/Goals Outcome: Progressing Progress to care plan goals: Pain: pt c/o headache tylenol given with satisfactory results Hemodynamically pt has been stable No obvious complications Patient has had minimal appetite, but does eat some.  Sitter encourages POs Pt is a two assist to bedside commode with walker.  Leans back when standing

## 2015-01-19 NOTE — Consult Note (Signed)
  Psychiatry: Follow-up note for this patient with a history of bipolar disorder currently in the hospital with failure to thrive and lung cancer. On evaluation this evening he seems to be in worse spirits than he was last night. He is rambling a little bit more and seems intermittently confused. Not completely so.  On review of systems he denies any pain. Says that he feels bad and really articulate in what way.  On mental status patient does seem more confused and anxious. Eye contact worse. Speech harder to understand. Affect seems more nervous.  I'm going to add a little bit of Seroquel during the daytime 50 mg twice a day morning and supper time in addition to his nighttime dose. In the past he had tolerated doses as high as 800 mg at night. I will also plan to check his lithium level in the next day or so to make sure we are not overshooting with his lithium dose. Reminded the patient that he has when necessary benzodiazepines. No other change. Supportive counseling. Will continue to follow.

## 2015-01-19 NOTE — Progress Notes (Signed)
Social worker contacted Chaplain to complete Advance Directive at the asking of Vincent Andrade. Friends\neighbors were at bedside interacting with patient.  Vincent Andrade identified his health care agent and the paperwork was completed with him signing in front of two witnesses and a notary (no relations). The original and copy was given to Vincent Andrade. Also a copy was given to his nurse to be placed within his chart.                   [name], Chaplain [phone]

## 2015-01-20 ENCOUNTER — Inpatient Hospital Stay: Payer: Medicare Other

## 2015-01-20 LAB — CBC
HCT: 27.4 % — ABNORMAL LOW (ref 40.0–52.0)
HEMOGLOBIN: 8.7 g/dL — AB (ref 13.0–18.0)
MCH: 26.8 pg (ref 26.0–34.0)
MCHC: 31.7 g/dL — ABNORMAL LOW (ref 32.0–36.0)
MCV: 84.8 fL (ref 80.0–100.0)
Platelets: 310 10*3/uL (ref 150–440)
RBC: 3.23 MIL/uL — ABNORMAL LOW (ref 4.40–5.90)
RDW: 14.4 % (ref 11.5–14.5)
WBC: 9 10*3/uL (ref 3.8–10.6)

## 2015-01-20 LAB — LITHIUM LEVEL: Lithium Lvl: 0.34 mmol/L — ABNORMAL LOW (ref 0.60–1.20)

## 2015-01-20 MED ORDER — LACTULOSE 10 GM/15ML PO SOLN
30.0000 g | Freq: Two times a day (BID) | ORAL | Status: DC
  Administered 2015-01-20: 10:00:00 40 g via ORAL
  Administered 2015-01-20 – 2015-01-22 (×4): 30 g via ORAL
  Administered 2015-01-22: 23:00:00 40 g via ORAL
  Administered 2015-01-23 – 2015-01-27 (×5): 30 g via ORAL
  Filled 2015-01-20 (×12): qty 60

## 2015-01-20 NOTE — Progress Notes (Signed)
[place] at [place] NAME: Vincent Andrade    MR#:  [id number]  DATE OF BIRTH:  02-Jun-1936  SUBJECTIVE;  seen today has some cough. Po intake continues to be poor. I spoke with Dr. Grayland Ormond who is going to call his daughter Vincent Andrade.   CHIEF COMPLAINT:   Chief Complaint  Patient presents with  . Failure To Thrive   Denies any symptoms, not reliable.  DRUG ALLERGIES:  No Known Allergies  VITALS:  Blood pressure 94/64, pulse 80, temperature 98 F (36.7 C), temperature source Oral, resp. rate 20, height '6\' 1"'$  (1.854 m), weight 59.421 kg (131 lb), SpO2 100 %.  PHYSICAL EXAMINATION:  GENERAL:  79 y.o.-year-old patient lying in the bed with no acute distress. Thin.. Chronically ill-looking EYES:  No scleral icterus. Extraocular muscles intact.  HEENT: Head atraumatic, normocephalic. Oropharynx and nasopharynx clear.  NECK:  Supple, no jugular venous distention. No thyroid enlargement, no tenderness.  LUNGS: Normal breath sounds bilaterally, mild wheezing, no rales,rhonchi or crepitation. No use of accessory muscles of respiration.  CARDIOVASCULAR: S1, S2 normal. No murmurs, rubs, or gallops.  ABDOMEN: Soft, nontender, nondistended. Bowel sounds present. No organomegaly or mass.  EXTREMITIES: No pedal edema, cyanosis, or clubbing.  NEUROLOGIC: Cranial nerves II through XII are intact. Muscle strength 5/5 in all extremities. Sensation intact. Gait not checked.  PSYCHIATRIC: The patient is alert and oriented x 3.  SKIN: No obvious rash, lesion, or ulcer.    LABORATORY PANEL:   CBC  Recent Labs Lab 01/20/15 0526  WBC 9.0  HGB 8.7*  HCT 27.4*  PLT 310   ------------------------------------------------------------------------------------------------------------------  Chemistries   Recent Labs Lab 01/14/15 1837 01/15/15 0128 01/19/15 0533  NA 136 135  --   K 4.6 4.1  --   CL 100* 100*  --   CO2 28 25  --   GLUCOSE 137*  102*  --   BUN 24* 21*  --   CREATININE 1.04 1.04  1.02 0.97  CALCIUM 8.6* 8.6*  --   MG  --  1.9  --   AST 40  --   --   ALT 46  --   --   ALKPHOS 199*  --   --   BILITOT 1.7*  --   --    ------------------------------------------------------------------------------------------------------------------  Cardiac Enzymes  Recent Labs Lab 01/14/15 1837  TROPONINI <0.03   ------------------------------------------------------------------------------------------------------------------  RADIOLOGY:  Dg Chest 1 View  01/20/2015   CLINICAL DATA:  Pneumonia.  Lung cancer.  EXAM: CHEST  1 VIEW  COMPARISON:  CT 01/14/2015.  Chest x-ray 08/15/2007.  FINDINGS: Mediastinum and hilar structures are stable. Persistent right apical mass with destructive changes of the right posterior third rib. Lungs are clear of acute infiltrates. Heart size normal. No pleural effusion or pneumothorax. No other focal bony lesions identified .  IMPRESSION: Persistent right apical mass with destructive changes of the right posterior third rib.   Electronically Signed   By: Marcello Moores  Register   On: 01/20/2015 07:22    EKG:   Orders placed or performed during the hospital encounter of 01/14/15  . ED EKG  . ED EKG  . EKG 12-Lead  . EKG 12-Lead    ASSESSMENT AND PLAN:  Admitted for  Not able to manage at home,did not change clothes and took shower for week and not eating.found to have  Large lung mass/ Lung cancer with meta to chest walls. Palliative care staff d/w pt's FM, now  on DNR.  Daughter Vincent Andrade requested to speak to Dr. Grayland Ormond regarding treatment options. Called Dr. Grayland Ormond and updated.  Failure to thrive in adult -malnutrition;continue regular diet,nutritional supplements. Patient is very deconditioned continued to have poor by mouth intake. Chronically looking.  Depression - f/u psychiatry consult, on IVC.Marland Kitchen Continue lithium, Paxil, Seroquel. Monitor closely, psych follow-up regarding IVC, Dr. Weber Cooks is   Following., Obtain lithium levels..  Bipolar disorder -psychiatry consult as above.   COPD;stable, no wheezing but requiring 2 L of oxygen due to lung cancer continue it. CODE STATUS DO NOT RESUSCITATE Physical therapy  evaluation requested All the records are reviewed and case discussed with Care Management/Social Workerr. Management plans discussed with the patient, family and they are in agreement.  CODE STATUS: DNR TOTAL TIME TAKING CARE OF THIS PATIENT: 46  minutes.   POSSIBLE D/C IN 2 AYS, DEPENDING ON CLINICAL CONDITION.   Epifanio Lesches M.D on 01/20/2015 at 12:19 PM  Between 7am to 6pm - Pager - [phone]  After 6pm go to [email or web address] - password [email or web address] [facility]  [facility] [facility]  Office  [phone]  CC: Primary care physician; No primary care provider on file.

## 2015-01-20 NOTE — Plan of Care (Signed)
Problem: Discharge Progression Outcomes Goal: Other Discharge Outcomes/Goals Outcome: Progressing Incident between pt's neighbors today who got pt to give them Healthcare POA yesterday and pt's wife and daughter.   Security called.  Parties were encouraged to focus on best outcome for pt.  Guardianship hearing tomorrow will determine  Outcome of POA.  Pt still has sitter b/c of confusion.  IVC expires tomorrow.

## 2015-01-20 NOTE — Plan of Care (Signed)
Problem: Discharge Progression Outcomes Goal: Discharge plan in place and appropriate Individualization: Outcome: Progressing Patient is a High Fall Risk, states he has fallen multiple times Patient is from home alone, involuntary admit. Sitter at Bedside Admitted d/t inability to take care of self--not bathing, not taking medications, eating or drinking. Patient states he is separated from his wife. States he has no family support Patient is Catholic. Goal: Other Discharge Outcomes/Goals Outcome: Progressing Patient is confused, knows who he is and is disoriented to place, time, situation. No c/o discomfort. Must be encouraged to drink.

## 2015-01-20 NOTE — Care Management (Signed)
Physical therapy consult requested from Dr. Vianne Bulls.

## 2015-01-20 NOTE — Clinical Social Work Note (Signed)
Clinical Education officer, museum spoke with [place] worker, Ms. Regino Schultze. Pt's guardianship case is going to court tomorrow. DSS worker will follow up the outcome of guardianship hearing and will send documents. CSW will continue to follow.   Darden Dates, MSW, LCSW Clinical Social Worker [phone]

## 2015-01-20 NOTE — Plan of Care (Signed)
Problem: Discharge Progression Outcomes Goal: Discharge plan in place and appropriate Individualization: Individualization; Pt's IVC expires tomorrow.  Dr. Weber Cooks to evaluate if this needs to be extended.  Guardianship hearing tomorrow. APS also involved.  Pt desats severely when he gets up to use BSC. May want to use bedpan.  No BM since 5/13 - lacutose ordered bid.

## 2015-01-21 LAB — BASIC METABOLIC PANEL
Anion gap: 6 (ref 5–15)
BUN: 25 mg/dL — ABNORMAL HIGH (ref 6–20)
CALCIUM: 8.3 mg/dL — AB (ref 8.9–10.3)
CO2: 30 mmol/L (ref 22–32)
CREATININE: 1.1 mg/dL (ref 0.61–1.24)
Chloride: 101 mmol/L (ref 101–111)
GFR calc Af Amer: >60 mL/min (ref >60)
GFR calc non Af Amer: >60 mL/min (ref >60)
Glucose, Bld: 140 mg/dL — ABNORMAL HIGH (ref 65–99)
Potassium: 3.9 mmol/L (ref 3.5–5.1)
Sodium: 137 mmol/L (ref 135–145)

## 2015-01-21 LAB — CREATININE, SERUM: Creatinine, Ser: 0.94 mg/dL (ref 0.61–1.24)

## 2015-01-21 MED ORDER — RISPERIDONE 0.5 MG PO TBDP
0.5000 mg | ORAL_TABLET | Freq: Three times a day (TID) | ORAL | Status: DC
  Administered 2015-01-22 – 2015-01-27 (×17): 0.5 mg via ORAL
  Filled 2015-01-21 (×20): qty 1

## 2015-01-21 MED ORDER — SODIUM CHLORIDE 0.9 % IV SOLN
INTRAVENOUS | Status: DC
  Administered 2015-01-21 – 2015-01-26 (×8): via INTRAVENOUS

## 2015-01-21 MED ORDER — TUBERCULIN PPD 5 UNIT/0.1ML ID SOLN
5.0000 [IU] | Freq: Once | INTRADERMAL | Status: AC
  Administered 2015-01-21: 5 [IU] via INTRADERMAL
  Filled 2015-01-21 (×2): qty 0.1

## 2015-01-21 NOTE — Progress Notes (Signed)
Chaplain responded to medical alert @ 9:10pm, met with medical care team, offered prayer. Loralyn Freshwater D. Alroy Dust Thursday 01-21-2015   01/21/15 2110  Clinical Encounter Type  Visited With Patient;Health care provider  Visit Type Code (medical alert @ 9:10pm)  Referral From Nurse  Consult/Referral To Chaplain  Spiritual Encounters  Spiritual Needs Prayer  Stress Factors  Patient Stress Factors Health changes (patient became non-responsive)  Family Stress Factors None identified

## 2015-01-21 NOTE — Progress Notes (Signed)
[facility] [place] NAME: Vincent Andrade    MR#:  [id number]  DATE OF BIRTH:  05/31/1936  SUBJECTIVE;  seen today has some cough. Po intake continues to be poor. I spoke with Dr. Grayland Ormond who is going to call his daughter [name].   CHIEF COMPLAINT:   Chief Complaint  Patient presents with  . Failure To Thrive   Denies any symptoms, avoids eye contact , rambling, but tells me that he doesn't feel well, however, denies any significant pains. Not able to review systems due to his confusion.  DRUG ALLERGIES:  No Known Allergies  VITALS:  Blood pressure 110/64, pulse 89, temperature 97.7 F (36.5 C), temperature source Axillary, resp. rate 20, height '6\' 1"'$  (1.854 m), weight 59.421 kg (131 lb), SpO2 98 %.  PHYSICAL EXAMINATION:  GENERAL:  79 y.o.-year-old patient lying in the bed with no acute distress. Thin.. Chronically ill-looking. Talking to himself and leaning to one side, no eye contact . Very pale even grayish yellowish skin tint.  EYES:  No scleral icterus. Extraocular muscles intact.  HEENT: Head atraumatic, normocephalic. Oropharynx and nasopharynx clear.  NECK:  Supple, no jugular venous distention. No thyroid enlargement, no tenderness.  LUNGS: Some diminished breath sounds bilaterally, but poor effort, few wheezes noted, no rales,rhonchi or crepitation. No use of accessory muscles of respiration.  CARDIOVASCULAR: S1, S2 normal. No murmurs, rubs, or gallops.  ABDOMEN: Soft, nontender, nondistended. Bowel sounds present. No organomegaly or mass.  EXTREMITIES: No pedal edema, cyanosis, or clubbing.  NEUROLOGIC: Cranial nerves II through XII are intact. Muscle strength 5/5 in all extremities. Sensation intact. Gait not checked.  PSYCHIATRIC: The patient is alert , not oriented .  SKIN: No obvious rash, lesion, or ulcer.    LABORATORY PANEL:   CBC  Recent Labs Lab 01/20/15 0526  WBC 9.0  HGB 8.7*  HCT 27.4*  PLT 310    ------------------------------------------------------------------------------------------------------------------  Chemistries   Recent Labs Lab 01/14/15 1837 01/15/15 0128  01/21/15 0616  NA 136 135  --   --   K 4.6 4.1  --   --   CL 100* 100*  --   --   CO2 28 25  --   --   GLUCOSE 137* 102*  --   --   BUN 24* 21*  --   --   CREATININE 1.04 1.04  1.02  < > 0.94  CALCIUM 8.6* 8.6*  --   --   MG  --  1.9  --   --   AST 40  --   --   --   ALT 46  --   --   --   ALKPHOS 199*  --   --   --   BILITOT 1.7*  --   --   --   < > = values in this interval not displayed. ------------------------------------------------------------------------------------------------------------------  Cardiac Enzymes  Recent Labs Lab 01/14/15 1837  TROPONINI <0.03   ------------------------------------------------------------------------------------------------------------------  RADIOLOGY:  Dg Chest 1 View  01/20/2015   CLINICAL DATA:  Pneumonia.  Lung cancer.  EXAM: CHEST  1 VIEW  COMPARISON:  CT 01/14/2015.  Chest x-ray 08/15/2007.  FINDINGS: Mediastinum and hilar structures are stable. Persistent right apical mass with destructive changes of the right posterior third rib. Lungs are clear of acute infiltrates. Heart size normal. No pleural effusion or pneumothorax. No other focal bony lesions identified .  IMPRESSION: Persistent right apical mass with destructive changes of the  right posterior third rib.   Electronically Signed   By: Marcello Moores  Register   On: 01/20/2015 07:22    EKG:   Orders placed or performed during the hospital encounter of 01/14/15  . ED EKG  . ED EKG  . EKG 12-Lead  . EKG 12-Lead    ASSESSMENT AND PLAN:  Admitted for  Not able to manage at home,did not change clothes and took shower for week and not eating.found to have  Large lung mass/ * Lung cancer with meta to chest walls. Palliative care staff d/w pt's FM, now DNR.  Daughter [name] requested to speak to Dr.  Grayland Ormond regarding treatment options. Called Dr. Grayland Ormond and updated. Unfortunately, adult protective services got involved and patient's family is to be in court today 18th  of May for guardianship issues.   *Failure to thrive in adult -malnutrition;continue regular diet,nutritional supplements. Patient is very deconditioned continued to have poor by mouth intake. Chronically looking. Get PT and make decisions about discharge planning.   * Bipolar-Depression - f/u psychiatry consult, on IVC.Marland Kitchen Continue lithium, Paxil, Seroquel. Monitoring closely, sitter at the bedside. psych follow-up regarding IVC, Dr. Weber Cooks is  Following., lithium levels as low at 0.34. Psychiatrist may advance it  * COPD;stable, no wheezing but requiring 2 L of oxygen due to lung cancer continue it. CODE STATUS DO NOT RESUSCITATE Physical therapy  evaluation requested All the records are reviewed and case discussed with Care Management/Social Workerr. Management plans discussed with the patient, family and they are in agreement.  CODE STATUS: DNR TOTAL TIME TAKING CARE OF THIS PATIENT: 40 minutes.      Theodoro Grist M.D on 01/21/2015 at 11:33 AM  Between 7am to 6pm - Pager - [phone]  After 6pm go to [email or web address] - password [email or web address]  [facility]  Office  [phone]  CC: Primary care physician; No primary care provider on file.

## 2015-01-21 NOTE — Plan of Care (Signed)
Problem: Acute Rehab PT Goals(only PT should resolve) Goal: Pt Will Ambulate Level surfaces     

## 2015-01-21 NOTE — Evaluation (Signed)
Physical Therapy Evaluation Patient Details Name: Vincent Andrade MRN: [id number] DOB: 08/22/1936 Today's Date: 01/21/2015   History of Present Illness  79 yo male with onset of FTT with apical mass and R posterior rib destructive changes was referred to PT and noted to have metastatic ca, bipolar depression, COPD on 2L, asthma, dementia, IBS, schizophrenia  Clinical Impression  Pt was seen for attempting of movement and was able to assist with mobility but cannot focus for gait today.  Vitals were reasonable but has tachycardia with standing with agitation.  Will try for calmer environment and reattempt gait tomorrow.  SNF is best dc environment as pt is not able to walk yet.    Follow Up Recommendations SNF    Equipment Recommendations  Rolling walker with 5" wheels    Recommendations for Other Services       Precautions / Restrictions Precautions Precautions: Fall Precaution Comments: pt is demented and unable to understand safety information Restrictions Weight Bearing Restrictions: No      Mobility  Bed Mobility Overal bed mobility: Needs Assistance;+2 for physical assistance;+ 2 for safety/equipment Bed Mobility: Rolling;Supine to Sit;Sit to Supine Rolling: Supervision;Min guard;Min assist;Mod assist   Supine to sit: Supervision;Min guard;Min assist;Mod assist Sit to supine: Supervision;Min guard;Min assist;Mod assist   General bed mobility comments: variable depending on pt's frame of mind with the requrest  Transfers Overall transfer level: Needs assistance Equipment used: 2 person hand held assist;Rolling walker (2 wheeled) (walker used with heavy frame to restore stability) Transfers: Sit to/from [facility] Sit to Stand: Min guard;Min assist;Mod assist;Max assist Stand pivot transfers: Min guard;Min assist;Mod assist;Max assist;Supervision       General transfer comment: variable due to cognition  Ambulation/Gait              General Gait Details: unable  Stairs            Wheelchair Mobility    Modified Rankin (Stroke Patients Only)       Balance Overall balance assessment: Needs assistance Sitting-balance support: Feet supported;Bilateral upper extremity supported Sitting balance-Leahy Scale: Fair   Postural control: Posterior lean Standing balance support: Bilateral upper extremity supported Standing balance-Leahy Scale: Poor                               Pertinent Vitals/Pain Pain Assessment: No/denies pain    Home Living Family/patient expects to be discharged to:: Unsure                      Prior Function Level of Independence: Needs assistance   Gait / Transfers Assistance Needed: information unavailable           Hand Dominance        Extremity/Trunk Assessment   Upper Extremity Assessment: Overall WFL for tasks assessed           Lower Extremity Assessment: Generalized weakness      Cervical / Trunk Assessment: Normal  Communication   Communication: Expressive difficulties;Other (comment) (confusion)  Cognition Arousal/Alertness: Awake/alert Behavior During Therapy: Agitated;Impulsive Overall Cognitive Status: History of cognitive impairments - at baseline       Memory: Decreased short-term memory;Decreased recall of precautions              General Comments General comments (skin integrity, edema, etc.): Pt is agitated with most requests and PT worked with him on getting up to stand to check vitals with the effort, pulses  93 to 115 from supine to stand.  O2 sat comfortably 100% throughout    Exercises        Assessment/Plan    PT Assessment Patient needs continued PT services  PT Diagnosis Difficulty walking;Altered mental status   PT Problem List Decreased strength;Decreased range of motion;Decreased activity tolerance;Decreased balance;Decreased mobility;Decreased coordination;Decreased cognition;Decreased knowledge  of use of DME;Decreased safety awareness;Decreased knowledge of precautions;Cardiopulmonary status limiting activity  PT Treatment Interventions DME instruction;Gait training;Functional mobility training;Therapeutic activities;Therapeutic exercise;Balance training;Neuromuscular re-education;Cognitive remediation;Patient/family education   PT Goals (Current goals can be found in the Care Plan section) Acute Rehab PT Goals Patient Stated Goal: none stated PT Goal Formulation: Patient unable to participate in goal setting Time For Goal Achievement: 02/04/15 Potential to Achieve Goals: Good    Frequency Min 2X/week   Barriers to discharge Other (comment) (unsure of final destination)      Co-evaluation               End of Session   Activity Tolerance: Patient tolerated treatment well;Treatment limited secondary to agitation Patient left: in bed;with call bell/phone within reach;with bed alarm set;with nursing/sitter in room Nurse Communication: Mobility status         Time: 3382-5053 PT Time Calculation (min) (ACUTE ONLY): 32 min   Charges:   PT Evaluation $Initial PT Evaluation Tier I: 1 Procedure PT Treatments $Therapeutic Activity: 8-22 mins   PT G CodesRamond Dial 02/09/2015, 4:04 PM   Mee Hives, PT MS Acute Rehab Dept. Number: [phone] [phone] and [place] [phone]

## 2015-01-21 NOTE — Clinical Social Work Note (Signed)
Clinical Social Worker spoke with DSS Worker regarding outcome of the Guardianship hearing. DSS is now the interim guardian. Paperwork is on pt's chart. PT eval is pending. CSW will continue to follow.   Darden Dates, MSW, LCSW Clinical Social Worker  [phone]

## 2015-01-21 NOTE — Plan of Care (Signed)
Problem: Discharge Progression Outcomes Goal: Discharge plan in place and appropriate Individualization: Outcome: Progressing Patient is a High Fall Risk, states he has fallen multiple times Patient is from home alone, involuntary admit. Sitter at Bedside Admitted d/t inability to take care of self--not bathing, not taking medications, eating or drinking. Patient states he is separated from his wife. States he has no family support Patient is Catholic. IVC expired. Pt has guardianship with [place] DSS Goal: Other Discharge Outcomes/Goals Outcome: Progressing Progress toward plan of care goals: Pt has c/o of pain secondary to passing gas resulting in no pain. Pt is currently on 4 l [place] oxygen saturation decreases with increase activity to the high 80's Pt able to void this am but had increasing urgency with no urination. Dr Ether Griffins notified and foley order entered. Foley placed with minimal difficulty.  Urine dark yellow and emptied  Approximately 350 ml post void. Pt is a max assist of 2 people to stand and pivot to bsc. Worked with PT today Pt had papers delivered today stating he was not able to make his own medical decisions.

## 2015-01-21 NOTE — Progress Notes (Signed)
Called to room by Nurse Tech and sitter.  Pt having seizure like activity, followed by irregular breathing and becoming post ictal.  Rapid Response called.  Pt vitals were 126/82, P 92, 99% on 5L O2.  Dr. Lavetta Nielsen called by Nursing Supervisor and new order put in by MD. Clarise Cruz, RN

## 2015-01-21 NOTE — Progress Notes (Signed)
Pt. Stating he needs to urinate but is unable to void.  Bladder scan results 443 ml.  Dr Ether Griffins notified and is entering order for foley.  Also mentioned that patient still has not had BM,  Flatus and stool smear only after lactulose.

## 2015-01-21 NOTE — Consult Note (Signed)
  Psychiatry: Follow-up for this 79 year old man with a history of bipolar disorder but currently with delirium. On interview today the patient himself has no specific complaints. Information obtained from nursing staff and the chart. Nursing staff report that he is continued to be confused throughout the day with waxing and waning mental state. Doesn't appear that he's been violent or agitated particularly.  Patient himself does not give any coherent response to any review of systems.  On mental status this is a disheveled acutely and chronically ill looking man. Only makes intermittent eye contact. Waxing and waning mental status. Easily distracted. Speaking to himself much of the time. Not able to tell me where he is. Not able to remember why he is in the hospital anymore. He is awake however.  Lithium level was low but I'm a little hesitant to boost it up because I certainly don't want to make him lithium toxic. Current mental state seems mostly delirious. Yesterday he was more sedated today less so. I am going to discontinue the daytime doses of Seroquel and replace them with Risperdal 0.5 mg 3 times a day as a medicine that may be more helpful for the delirium with less oversedation. Continue other medicines for his chronic mental illness. We'll follow-up as needed. No change to diagnosis

## 2015-01-21 NOTE — Progress Notes (Signed)
Called by nursing staff for patient having seizure-like activity, currently nonfocal neurological exam however post ictal Does have history of lung cancer but has previous CT head without no metastases will hold off on repeating that now We'll check a BMP, provide IV fluid hydration, seizure precautions with neuro checks

## 2015-01-21 NOTE — Plan of Care (Signed)
Problem: Discharge Progression Outcomes Goal: Discharge plan in place and appropriate Individualization: Outcome: Progressing Patient is a High Fall Risk, states he has fallen multiple times Patient is from home alone, involuntary admit. Sitter at Bedside Admitted d/t inability to take care of self--not bathing, not taking medications, eating or drinking. Patient states he is separated from his wife. States he has no family support Patient is Catholic. Pt IVC expires 01/21/2015. Goal: Tolerating diet Outcome: Not Progressing Pt still with poor po intake  Goal: Other Discharge Outcomes/Goals Outcome: Not Progressing Plan of care progress to goal Pain control: Progressing. No s/sx of discomfort, patient denies pain. Hemodynamically stable: Unstable, desats upon standing.  Tolerating diet: Not progressing.Pt with significant weight loss. Poor po intake. Magic cup ordered at meals. Soft diet ordered; pt has poor dentition,pt has upper dentures. Oral care provided. Assisted with meals.  Activity appropriate: Not progressing.Pt oxygen saturations decrease upon standing. Utilized bedpan instead of bedside commode.  Social work involved d/t patient poor home conditions and care providers.

## 2015-01-22 ENCOUNTER — Inpatient Hospital Stay: Payer: Medicare Other

## 2015-01-22 LAB — CREATININE, SERUM
Creatinine, Ser: 0.9 mg/dL (ref 0.61–1.24)
GFR calc non Af Amer: >60 mL/min (ref >60)

## 2015-01-22 MED ORDER — LEVETIRACETAM 500 MG PO TABS
500.0000 mg | ORAL_TABLET | Freq: Two times a day (BID) | ORAL | Status: DC
  Administered 2015-01-22 – 2015-01-27 (×11): 500 mg via ORAL
  Filled 2015-01-22 (×11): qty 1

## 2015-01-22 NOTE — Consult Note (Signed)
Palliative Medicine Inpatient Consult Follow Up Note   Name: Domenique Southers Date: 01/22/2015 MRN: [id number]  DOB: 08-Mar-1936  Referring Physician: Theodoro Grist, MD  Palliative Care consult requested for this 79 y.o. male for goals of medical therapy in patient with.metastatic bronchogenic carcinoma, COPD, Bipolar, depression, dementia, HLD, IBS, and h/o panick attacks.  Pt currently resting in bed and receiving bed bath.   REVIEW OF SYSTEMS:  Patient is not able to provide ROS  CODE STATUS: DNR   PAST MEDICAL HISTORY: Past Medical History  Diagnosis Date  . Asthma   . Bipolar disorder   . Depression   . Dementia   . HLD (hyperlipidemia)   . IBS (irritable bowel syndrome)   . Schizophrenia   . COPD (chronic obstructive pulmonary disease)   . Panic attacks   . Lung cancer 01/14/2015    PAST SURGICAL HISTORY:  Past Surgical History  Procedure Laterality Date  . Appendectomy      Vital Signs: BP 114/64 mmHg  Pulse 87  Temp(Src) 98.9 F (37.2 C) (Oral)  Resp 24  Ht '6\' 1"'$  (1.854 m)  Wt 59.421 kg (131 lb)  BMI 17.29 kg/m2  SpO2 95% Filed Weights   01/14/15 1826 01/14/15 2353  Weight: 81.647 kg (180 lb) 59.421 kg (131 lb)    Estimated body mass index is 17.29 kg/(m^2) as calculated from the following:   Height as of this encounter: '6\' 1"'$  (1.854 m).   Weight as of this encounter: 59.421 kg (131 lb).  PHYSICAL EXAM: General: cachectic HEENT: OP clear, moist oral mucosa Neck: Trachea midline  Cardiovascular: RRR Pulmonary/Chest: Clear ant fields Abdominal: Soft, NTTP. Hypoactive bowel sounds GU: No SP tenderness, No CVA tenderness Extremities: No edema  Neurological: Grossly nonfocal Skin: Warm, dry and intact.  Psychiatric: anxious,  LABS: CBC:    Component Value Date/Time   WBC 9.0 01/20/2015 0526   HGB 8.7* 01/20/2015 0526   HCT 27.4* 01/20/2015 0526   PLT 310 01/20/2015 0526   MCV 84.8 01/20/2015 0526   NEUTROABS 7.6* 01/14/2015 1837   LYMPHSABS 0.8* 01/14/2015 1837   MONOABS 1.0 01/14/2015 1837   EOSABS 0.1 01/14/2015 1837   BASOSABS 0.1 01/14/2015 1837   Comprehensive Metabolic Panel:    Component Value Date/Time   NA 137 01/21/2015 2239   K 3.9 01/21/2015 2239   CL 101 01/21/2015 2239   CO2 30 01/21/2015 2239   BUN 25* 01/21/2015 2239   CREATININE 0.90 01/22/2015 0544   GLUCOSE 140* 01/21/2015 2239   CALCIUM 8.3* 01/21/2015 2239   AST 40 01/14/2015 1837   ALT 46 01/14/2015 1837   ALKPHOS 199* 01/14/2015 1837   BILITOT 1.7* 01/14/2015 1837   PROT 6.9 01/14/2015 1837   ALBUMIN 2.2* 01/14/2015 1837    IMPRESSION:  Mr. Moulder 79 y.o. male with COPD, Bipolar, depression, dementia, HLD, IBS, and h/o panick attacks. Pt presented to ER after being involuntaril committed due to unmanageable living situation. ER workup shows lung mass and was admitted for evaluation and treatment. Family at bedside.  Palliative medicine was asked to evaluate for Hospice Homeplacement after seizure activity last PM.  After review of chart pt has eaten 25-100 of meals prior to last nights event.  Pt with no known brain matastasis.  Reviewed case with Hospice home director and states he is not appropriate at this time.  Hospice home director states if pt continues to not eat or drink over the weekend to re-evaluate and they would consider taking.  PLAN: See above    More than 50% of the visit was spent in counseling/coordination of care: YES  Time Spent: 35 minutes

## 2015-01-22 NOTE — Plan of Care (Signed)
Problem: Discharge Progression Outcomes Goal: Discharge plan in place and appropriate Individualization: Outcome: Not Progressing Patient is a High Fall Risk, states he has fallen multiple times Patient is from home alone, involuntary admit. Sitter at Bedside Admitted d/t inability to take care of self--not bathing, not taking medications, eating or drinking. Patient states he is separated from his wife. States he has no family support Patient is Catholic. Pt IVC expires 01/21/2015.    Goal: Other Discharge Outcomes/Goals Outcome: Not Progressing Plan of care progress to goal Pain control: No s/sx of discomfort, patient denies pain. Hemodynamically stable: desats upon standing.  VSS NS '@75ml'$ /hr Tolerating diet: Poor po intake. Magic cup ordered at meals.   Activity appropriate:Pt oxygen saturations decrease upon standing. Utilized bedpan instead of bedside commode.

## 2015-01-22 NOTE — Progress Notes (Signed)
[facility] [place] NAME: Vincent Andrade    MR#:  [id number]  DATE OF BIRTH:  1936/06/02  SUBJECTIVE;  seen today has some cough. Po intake continues to be poor. I spoke with Dr. Grayland Ormond who is going to call his daughter [name].   CHIEF COMPLAINT:   Chief Complaint  Patient presents with  . Failure To Thrive   Confused, not able to discuss any pertinent issues. Mumbles by himself or talks about nonsensical problems.  Seizure-like activity last night was postictal period lasting unclear period of time. Nonfocal neurologic exam for physician on call at around 9 PM. CT scan of the head was not repeated.  DRUG ALLERGIES:  No Known Allergies  VITALS:  Blood pressure 119/71, pulse 91, temperature 97.3 F (36.3 C), temperature source Oral, resp. rate 20, height '6\' 1"'$  (1.854 m), weight 59.421 kg (131 lb), SpO2 100 %.  PHYSICAL EXAMINATION:  GENERAL:  79 y.o.-year-old patient lying in the bed with no acute distress. Thin.. Chronically ill-looking. Talking to himself and leaning to one side, no eye contact . Very pale even grayish yellowish skin tint.  EYES:  No scleral icterus. Extraocular muscles intact.  HEENT: Head atraumatic, normocephalic. Oropharynx and nasopharynx clear.  NECK:  Supple, no jugular venous distention. No thyroid enlargement, no tenderness.  LUNGS: Some diminished breath sounds bilaterally, but poor effort, few wheezes noted, no rales,rhonchi or crepitation. No use of accessory muscles of respiration.  CARDIOVASCULAR: S1, S2 normal. No murmurs, rubs, or gallops.  ABDOMEN: Soft, nontender, nondistended. Bowel sounds present. No organomegaly or mass.  EXTREMITIES: No pedal edema, cyanosis, or clubbing.  NEUROLOGIC: Cranial nerves II through XII are intact. Muscle strength 5/5 in all extremities. Sensation intact. Gait not checked.  PSYCHIATRIC: The patient is alert , not oriented .  SKIN: No obvious rash, lesion, or  ulcer.    LABORATORY PANEL:   CBC  Recent Labs Lab 01/20/15 0526  WBC 9.0  HGB 8.7*  HCT 27.4*  PLT 310   ------------------------------------------------------------------------------------------------------------------  Chemistries   Recent Labs Lab 01/21/15 2239 01/22/15 0544  NA 137  --   K 3.9  --   CL 101  --   CO2 30  --   GLUCOSE 140*  --   BUN 25*  --   CREATININE 1.10 0.90  CALCIUM 8.3*  --    ------------------------------------------------------------------------------------------------------------------  Cardiac Enzymes No results for input(s): TROPONINI in the last 168 hours. ------------------------------------------------------------------------------------------------------------------  RADIOLOGY:  No results found.  EKG:   Orders placed or performed during the hospital encounter of 01/14/15  . ED EKG  . ED EKG  . EKG 12-Lead  . EKG 12-Lead    ASSESSMENT AND PLAN:  Admitted for  Not able to manage at home,did not change clothes and took shower for week and not eating.found to have  Large lung mass/ * Lung cancer with meta to chest walls. Palliative care staff d/w pt's FM, now DNR.  Daughter [name] requested to speak to Dr. Grayland Ormond regarding treatment options. Adult protective services got involved and patient's family end up in court 18th  of May for guardianship issues. Current guardian is Miss Trey Sailors from [place] telephone number area code [phone], extension [phone].  * Seizures of unclear etiology at this time concerning for possible distant spread spread of metastasis, we will repeat the CT of head and will initiate patient on Keppra orally, will hold off Neurology consultation at present  *Failure to thrive in  adult -malnutrition;continue regular diet,nutritional supplements. Patient is very deconditioned continued to have poor by mouth intake. Chronically ill looking. Getting PT and discharge planning is to skilled nursing facility  with hospice.   * Bipolar-Depression - f/u psychiatry consult, on IVC. DR Clapacs does not feel that the patient is having bipolar disorder problems. More likely a delirium. Although patient's lithium level is low. Dr. Weber Cooks wanted  patient to continue current doses,  not to be advanced, he also recommended to discontinue Seroquel and change it to Risperdal at 0.5 mg 3 times daily dose Continue lithium, Paxil. Monitoring closely, sitter at the bedside. psych follow-up regarding IVC  * COPD;stable, no wheezing but requiring 2 L of oxygen due to lung cancer continue it. CODE STATUS DO NOT RESUSCITATE Physical therapy  evaluation requested for placement issues All the records are reviewed and case discussed with Care Management/Social Workerr. Management plans discussed with the patient, family and they are in agreement.  CODE STATUS: DNR TOTAL TIME TAKING CARE OF THIS PATIENT: 40 minutes.      Theodoro Grist M.D on 01/22/2015 at 4:01 PM  Between 7am to 6pm - Pager - [phone]  After 6pm go to [email or web address] - password [email or web address] [facility]  [facility]  Office  [phone]  CC: Primary care physician; No primary care provider on file.

## 2015-01-22 NOTE — Care Management (Signed)
Not appropriate for Hospice home at this time per pallative notes. Plan is for SNF with hospice. CSW folllowing.

## 2015-01-22 NOTE — Plan of Care (Signed)
Problem: Discharge Progression Outcomes Goal: Other Discharge Outcomes/Goals Patient is a High Fall Risk, states he has fallen multiple times Patient is from home alone, involuntary admit. Sitter at Bedside Admitted d/t inability to take care of self--not bathing, not taking medications, eating or drinking. Patient states he is separated from his wife. States he has no family support Patient is Catholic. IVC expired. Pt has guardianship with [place] No seizure activity today after 1 last night.  Pt started on Keppra and CT of head performed.   PO intake improved slightly today.  Still no BM although lactulose given.

## 2015-01-22 NOTE — Plan of Care (Signed)
Problem: Discharge Progression Outcomes Goal: Discharge plan in place and appropriate Individualization: Individualization: Outcome: Progressing Patient is a High Fall Risk, states he has fallen multiple times Patient is from home alone, involuntary admit. Sitter at Bedside Admitted d/t inability to take care of self--not bathing, not taking medications, eating or drinking. Patient states he is separated from his wife. States he has no family support Patient is Catholic. IVC expired. Pt has guardianship with [employer]

## 2015-01-22 NOTE — Progress Notes (Signed)
Nutrition Follow-up  DOCUMENTATION CODES:  Non-severe (moderate) malnutrition in context of chronic illness  INTERVENTION: Meals and Snacks: Cater to patient preferences Medical Food Supplement Therapy: will discontinue Ensure as pt not drinking and does not like. Will continue Magic Cup BID and add Mighty Shakes TID as pt likes chocolate milk. Magic cup Coordination of Care: pt would likely benefit from stronger bowel regimen.  NUTRITION DIAGNOSIS:  Inadequate oral intake related to acute illness as evidenced by meal completion < 25%.  GOAL:  Patient will meet greater than or equal to 90% of their needs; ongoing  MONITOR:   (Energy Intake, Electrolyte and Renal Profile, Digestive system, Anthropometrics)  REASON FOR ASSESSMENT: Follow-Up  ASSESSMENT:  Per palliative care team, pt not able to discharge to Hospice home at this time will likely discharge to SNF. Pt talking consistently about nonsensical things on visit this afternoon.  PO Intake: per sitter pt ate Magic Cup at lunch today and that is all he has had today.  Medications: lithium citrate, NS at 21m/hr Labs: Electrolyte and Renal Profile:    Recent Labs Lab 01/21/15 0616 01/21/15 2239 01/22/15 0544  BUN  --  25*  --   CREATININE 0.94 1.10 0.90  NA  --  137  --   K  --  3.9  --    Glucose Profile: No results for input(s): GLUCAP in the last 72 hours.   Height:  Ht Readings from Last 1 Encounters:  01/14/15 '6\' 1"'$  (1.854 m)    Weight:  Wt Readings from Last 1 Encounters:  01/14/15 131 lb (59.421 kg)    Wt Readings from Last 10 Encounters:  01/14/15 131 lb (59.421 kg)    BMI:  Body mass index is 17.29 kg/(m^2).  Estimated Nutritional Needs:  Kcal:  1765-2086kcals, BEE: 1338kcals, TEE: (IF 1.1-1.3)(AF 1.2)   Protein:  59-71g protein (1.0-1.2g/kg)  Fluid:  1475-17763mof fluid (25-3070mg)  Skin:     Diet Order:  DIET SOFT Room service appropriate?: Yes; Fluid consistency::  Thin  EDUCATION NEEDS:  No education needs identified at this time   Intake/Output Summary (Last 24 hours) at 01/22/15 1548 Last data filed at 01/22/15 0830  Gross per 24 hour  Intake    340 ml  Output    950 ml  Net   -610 ml    Last BM:  5/13  HIGH Care Level  AllDwyane LuoD, LDN Pager [phone]

## 2015-01-22 NOTE — Consult Note (Signed)
  Psychiatry: Follow-up note for this patient with a history of bipolar disorder current delirium. Got to see the patient today and speak with his wife and daughter as well. Patient's delirium seems to be worse. He was quite confused. Family reports that he has remained confused throughout the day.  Patient was not agitated or hostile. He was able to respond to some simple questions but did not make eye contact. Vital signs remained normal.  Family had some concern about the resumption of lithium. Although his lithium level was quite low I am happy to try discontinuing it to see if that might possibly improve some of his delirium. This is more likely to be related to the delirium that is the Seroquel I would think although we can readdress that if needed. I will continue to monitor and follow-up. No other change to treatment for right now.

## 2015-01-23 LAB — HEMOGLOBIN AND HEMATOCRIT, BLOOD
HEMATOCRIT: 24.6 % — AB (ref 40.0–52.0)
Hemoglobin: 7.7 g/dL — ABNORMAL LOW (ref 13.0–18.0)

## 2015-01-23 MED ORDER — HALOPERIDOL LACTATE 5 MG/ML IJ SOLN
1.0000 mg | Freq: Four times a day (QID) | INTRAMUSCULAR | Status: DC
  Administered 2015-01-23 – 2015-01-27 (×16): 1 mg via INTRAVENOUS
  Filled 2015-01-23 (×14): qty 1

## 2015-01-23 NOTE — Progress Notes (Signed)
Pt more confused, agitated, hostile/combative trying to hit staff if touched, cursing. Refused all attempts to feed, multiple attempts done to administer medications until had haldol given for agitation which was effective. Staffed with Dr. Bridgett Larsson in regards to increased confusion/agitation and to consider essential meds may need to be given ivyIV route. No seizure activity observed. 1:1 sitter still essental in maintaing pt safety. Dgt updated via TC.

## 2015-01-23 NOTE — Consult Note (Signed)
  Psychiatry: Follow-up for this patient with a history of bipolar disorder currently in the hospital delirious from lung cancer and medical problems. Patient today was awake but made no eye contact. His speech was incomprehensible to me. Slurring his words more. Psychomotor activity less at this time. Nursing reports however that he was very agitated this morning and last night. Was lashing out and striking at people. Mood was more angry but responded to antipsychotics.  Mental status patient is still very delirious and confused. Affect was blunted when I saw him.  I had discontinued lithium to try and avoid any worsening that might have on his delirium. Today he is still delirious but even more agitated. Case discussed with the nurse on duty. I agree with her that we can add a Haldol 1 mg as a standing dose of every 6 hours for now in addition to the low dose Risperdal. The antipsychotics seem to be helping to keep him from being agitated. No clear side effects present. We'll continue to follow-up.

## 2015-01-23 NOTE — Plan of Care (Signed)
Problem: Discharge Progression Outcomes Goal: Other Discharge Outcomes/Goals Outcome: Progressing Progress to Goal: 1. Very poor appetite- only take sips. Refused all attempts to feed. Dr.Chen made aware. IVf's continued. 2. Weaker, more lethargic; when awake pt hostile, combative, cursing, trying to hit staff with touch/interaction.   All diversional activities/comfort measures unsuccessful with Haldol required for safety/control agitation-effective. Unable to get pt to take meds until after haldol with meds now up to date. MD aware. Slept at intervals late morning and afternoon.

## 2015-01-23 NOTE — Progress Notes (Signed)
[facility] [place] NAME: Vincent Andrade    MR#:  [id number]  DATE OF BIRTH:  1936-06-19  SUBJECTIVE;  seen today has some cough. Po intake continues to be poor. I spoke with Dr. Grayland Ormond who is going to call his daughter [name].   CHIEF COMPLAINT:   Chief Complaint  Patient presents with  . Failure To Thrive   Confused and agitated. Mumbles by himself or talks about nonsensical problems. No more Seizure-like activity.  DRUG ALLERGIES:  No Known Allergies  VITALS:  Blood pressure 120/74, pulse 91, temperature 98.3 F (36.8 C), temperature source Oral, resp. rate 20, height '6\' 1"'$  (1.854 m), weight 59.421 kg (131 lb), SpO2 98 %.  PHYSICAL EXAMINATION:  GENERAL:  79 y.o.-year-old patient lying in the bed with no acute distress. Thin.. Chronically ill-looking. Talking to himself and leaning to one side, no eye contact . Very pale even grayish yellowish skin tint.  EYES:  No scleral icterus. Extraocular muscles intact.  HEENT: Head atraumatic, normocephalic. Oropharynx and nasopharynx clear.  NECK:  Supple, no jugular venous distention. No thyroid enlargement, no tenderness.  LUNGS: Some diminished breath sounds bilaterally, but poor effort, few wheezes noted, no rales,rhonchi or crepitation. No use of accessory muscles of respiration.  CARDIOVASCULAR: S1, S2 normal. No murmurs, rubs, or gallops.  ABDOMEN: Soft, nontender, nondistended. Bowel sounds present. No organomegaly or mass.  EXTREMITIES: No pedal edema, cyanosis, or clubbing.  NEUROLOGIC: Cranial nerves II through XII are intact. Muscle strength 5/5 in all extremities. Sensation intact. Gait not checked.  PSYCHIATRIC: The patient is alert , not oriented .  SKIN: No obvious rash, lesion, or ulcer.    LABORATORY PANEL:   CBC  Recent Labs Lab 01/20/15 0526 01/23/15 0526  WBC 9.0  --   HGB 8.7* 7.7*  HCT 27.4* 24.6*  PLT 310  --     ------------------------------------------------------------------------------------------------------------------  Chemistries   Recent Labs Lab 01/21/15 2239 01/22/15 0544  NA 137  --   K 3.9  --   CL 101  --   CO2 30  --   GLUCOSE 140*  --   BUN 25*  --   CREATININE 1.10 0.90  CALCIUM 8.3*  --    ------------------------------------------------------------------------------------------------------------------  Cardiac Enzymes No results for input(s): TROPONINI in the last 168 hours. ------------------------------------------------------------------------------------------------------------------  RADIOLOGY:  Ct Head Wo Contrast  01/22/2015   CLINICAL DATA:  Confused and unable to discuss pertinent issues. Seizure like activity last night and postictal. Nonfocal neurologic exam.  EXAM: CT HEAD WITHOUT CONTRAST  TECHNIQUE: Contiguous axial images were obtained from the base of the skull through the vertex without intravenous contrast.  COMPARISON:  01/14/2015  FINDINGS: There moderate central and cortical atrophy. There is no intra or extra-axial fluid collection or mass lesion. The basilar cisterns and ventricles have a normal appearance. There is no CT evidence for acute infarction or hemorrhage. No calvarial fracture. Atherosclerotic calcification of the internal carotid arteries.  IMPRESSION: 1. Atrophy. 2.  No evidence for acute intracranial abnormality.   Electronically Signed   By: Nolon Nations M.D.   On: 01/22/2015 18:15    EKG:   Orders placed or performed during the hospital encounter of 01/14/15  . ED EKG  . ED EKG  . EKG 12-Lead  . EKG 12-Lead    ASSESSMENT AND PLAN:  Admitted for  Not able to manage at home,did not change clothes and took shower for week and not eating.found to have  Large lung mass/ * Lung cancer with meta to chest walls. Palliative care staff d/w pt's FM, now DNR.  Daughter [name] requested to speak to Dr. Grayland Ormond regarding treatment  options. Adult protective services got involved and patient's family end up in court 18th  of May for guardianship issues. Current guardian is Miss Trey Sailors from [place] telephone number area code [phone], extension [phone].  * Seizures of unclear etiology at this time concerning for possible distant spread spread of metastasis, neg CT of head, cont Keppra. Precaution.  *Failure to thrive in adult -malnutrition;continue regular diet,nutritional supplements. Patient is very deconditioned continued to have poor by mouth intake. Chronically ill looking. Getting PT and discharge planning is to skilled nursing facility with hospice.   * Bipolar-Depression - on IVC. DR Clapacs does not feel that the patient is having bipolar disorder problems. More likely a delirium. Although patient's lithium level is low, discontinue per Dr. Weber Cooks.  He also recommended to discontinue Seroquel and change it to Risperdal at 0.5 mg 3 times daily dose Continue lithium, Paxil. Monitoring closely, sitter at the bedside. psych follow-up regarding IVC  * COPD;stable, no wheezing but requiring 2 L of oxygen due to lung cancer continue it.  * Anemiaof chronic disease: Hb down to 7.7, no active bleeding, f/u Hb. * Dehydration due to poor oral intake: f/u BMP.  CODE STATUS DO NOT RESUSCITATE Physical therapy  evaluation requested for placement issues All the records are reviewed and case discussed with Care Management/Social Workerr. Management plans discussed with the patient, family and they are in agreement.  CODE STATUS: DNR TOTAL TIME TAKING CARE OF THIS PATIENT: 41 minutes.      Demetrios Loll M.D on 01/23/2015 at 12:25 PM  Between 7am to 6pm - Pager - [phone]  After 6pm go to [email or web address] - password [email or web address] [facility]  [facility]  Office  [phone]  CC: Primary care physician; No primary care provider on file.

## 2015-01-23 NOTE — Plan of Care (Signed)
Problem: Discharge Progression Outcomes Goal: Discharge plan in place and appropriate Individualization: Individualization:  Lives at home with friend who helps care for him. Reported unhealthy and unsafe living conditions. Has been estranged from wife and family for approximately 8 years. Currently IVC for safety.

## 2015-01-24 LAB — CBC
HCT: 26.3 % — ABNORMAL LOW (ref 40.0–52.0)
Hemoglobin: 8.3 g/dL — ABNORMAL LOW (ref 13.0–18.0)
MCH: 26.9 pg (ref 26.0–34.0)
MCHC: 31.4 g/dL — ABNORMAL LOW (ref 32.0–36.0)
MCV: 85.5 fL (ref 80.0–100.0)
PLATELETS: 321 10*3/uL (ref 150–440)
RBC: 3.08 MIL/uL — ABNORMAL LOW (ref 4.40–5.90)
RDW: 14.2 % (ref 11.5–14.5)
WBC: 8.6 10*3/uL (ref 3.8–10.6)

## 2015-01-24 LAB — BASIC METABOLIC PANEL
Anion gap: 5 (ref 5–15)
BUN: 12 mg/dL (ref 6–20)
CHLORIDE: 106 mmol/L (ref 101–111)
CO2: 28 mmol/L (ref 22–32)
Calcium: 8.2 mg/dL — ABNORMAL LOW (ref 8.9–10.3)
Creatinine, Ser: 1.01 mg/dL (ref 0.61–1.24)
GFR calc Af Amer: >60 mL/min (ref >60)
GFR calc non Af Amer: >60 mL/min (ref >60)
Glucose, Bld: 89 mg/dL (ref 65–99)
Potassium: 4.2 mmol/L (ref 3.5–5.1)
Sodium: 139 mmol/L (ref 135–145)

## 2015-01-24 LAB — MAGNESIUM: Magnesium: 1.9 mg/dL (ref 1.7–2.4)

## 2015-01-24 NOTE — Plan of Care (Signed)
Problem: Discharge Progression Outcomes Goal: Other Discharge Outcomes/Goals Outcome: Progressing 1. Denies pain- no pain meds required this shift. 2. VSS. No significant changes. O2 continued. 3. Agitation/anxiety required extra doses of haldol prn and xanax to manage- 1:1 sitter still esssential for pt safety. IVC continued. 4. Very poor appetite- rare bite/drank only sips of water at intervals. 5. Bed rest today with pt too week to get OOB.

## 2015-01-24 NOTE — Plan of Care (Signed)
Problem: Discharge Progression Outcomes Goal: Other Discharge Outcomes/Goals Outcome: Progressing Patient sitter at beside Patient had no c/o pain this shift Patient had stable VS Patient had one stool this shift, patient was agitated x1 which was reduced with prn Haldol

## 2015-01-24 NOTE — Consult Note (Signed)
  Psychiatry: Follow-up for this patient with lung cancer and delirium. Patient seen today. Spoke with nursing. Patient apparently still is intermittently agitated although he is lashing out less than he was previously. He is responding well to the IV haloperidol and they are able to get the Risperdal dissolvable tab in him. Questionable how much she is able to cooperate with the Seroquel. Patient himself is not able to give any information. He is very confused and delirious. Doesn't make any eye contact doesn't speak in a coherent manner. Patient is not agitated affect is flat. Cognitively extremely impaired.  Discontinuing lithium does not seem to have made a difference one way or another but given that he is having a hard time taking oral medicine and his hydration status is questionable I'm not going to restart it. No change to medicine for today. He is already getting 3 different antipsychotics in an attempt to keep him calm from his delirium without worsening his oversedation. I will follow up as needed.

## 2015-01-24 NOTE — Progress Notes (Signed)
[facility] at [place] NAME: Vincent Andrade    MR#:  [id number]  DATE OF BIRTH:  06-22-1936  SUBJECTIVE;  seen today has some cough. Po intake continues to be poor. I spoke with Dr. Grayland Ormond who is going to call his daughter Vincent Andrade.   CHIEF COMPLAINT:   Chief Complaint  Patient presents with  . Failure To Thrive   Confused and quiet.  DRUG ALLERGIES:  No Known Allergies  VITALS:  Blood pressure 104/73, pulse 79, temperature 98 F (36.7 C), temperature source Oral, resp. rate 20, height '6\' 1"'$  (1.854 m), weight 59.421 kg (131 lb), SpO2 98 %.  PHYSICAL EXAMINATION:  GENERAL:  79 y.o.-year-old patient lying in the bed with no acute distress. Thin. Chronically ill-looking. EYES:  No scleral icterus. Extraocular muscles intact.  HEENT: Head atraumatic, normocephalic. The patient did not follow commands, unable to exam.  NECK:  Supple, no jugular venous distention. No thyroid enlargement, no tenderness.  LUNGS: Some diminished breath sounds bilaterally, but poor effort, few wheezes noted, no rales,rhonchi or crepitation. No use of accessory muscles of respiration.  CARDIOVASCULAR: S1, S2 normal. No murmurs, rubs, or gallops.  ABDOMEN: Soft, nontender, nondistended. Bowel sounds present. No organomegaly or mass.  EXTREMITIES: No pedal edema, cyanosis, or clubbing.  NEUROLOGIC: The patient didn't follow commands.unable to exam.  PSYCHIATRIC: The patient is confused.  SKIN: No obvious rash, lesion, or ulcer.    LABORATORY PANEL:   CBC  Recent Labs Lab 01/24/15 0438  WBC 8.6  HGB 8.3*  HCT 26.3*  PLT 321   ------------------------------------------------------------------------------------------------------------------  Chemistries   Recent Labs Lab 01/24/15 0438  NA 139  K 4.2  CL 106  CO2 28  GLUCOSE 89  BUN 12  CREATININE 1.01  CALCIUM 8.2*  MG 1.9    ------------------------------------------------------------------------------------------------------------------  Cardiac Enzymes No results for input(s): TROPONINI in the last 168 hours. ------------------------------------------------------------------------------------------------------------------  RADIOLOGY:  Ct Head Wo Contrast  01/22/2015   CLINICAL DATA:  Confused and unable to discuss pertinent issues. Seizure like activity last night and postictal. Nonfocal neurologic exam.  EXAM: CT HEAD WITHOUT CONTRAST  TECHNIQUE: Contiguous axial images were obtained from the base of the skull through the vertex without intravenous contrast.  COMPARISON:  01/14/2015  FINDINGS: There moderate central and cortical atrophy. There is no intra or extra-axial fluid collection or mass lesion. The basilar cisterns and ventricles have a normal appearance. There is no CT evidence for acute infarction or hemorrhage. No calvarial fracture. Atherosclerotic calcification of the internal carotid arteries.  IMPRESSION: 1. Atrophy. 2.  No evidence for acute intracranial abnormality.   Electronically Signed   By: Nolon Nations M.D.   On: 01/22/2015 18:15    EKG:   Orders placed or performed during the hospital encounter of 01/14/15  . ED EKG  . ED EKG  . EKG 12-Lead  . EKG 12-Lead    ASSESSMENT AND PLAN:  Admitted for  Not able to manage at home,did not change clothes and took shower for week and not eating.found to have  Large lung mass/ * Lung cancer with meta to chest walls. Palliative care staff d/w pt's FM, now DNR.  Daughter Vincent Andrade requested to speak to Dr. Grayland Ormond regarding treatment options. Adult protective services got involved and patient's family end up in court 18th  of May for guardianship issues. Current guardian is Miss Vincent Andrade from [place] telephone number area code [phone], extension [phone].  * Seizures of unclear etiology at  this time concerning for possible distant spread spread  of metastasis, neg CT of head, cont Keppra. Precaution.  *Failure to thrive in adult -malnutrition;continue regular diet,nutritional supplements. Patient is very deconditioned continuously has poor by mouth intake. Chronically ill looking. Getting PT and discharge planning is to skilled nursing facility with hospice.   * Bipolar-Depression - on IVC.  likely a delirium. per Dr. Weber Cooks, continue current treatment.  * COPD;stable.  * Anemia of chronic disease: Stable * Dehydration improved.   CODE STATUS DO NOT RESUSCITATE Physical therapy  evaluation requested for placement issues All the records are reviewed and case discussed with Care Management/Social Workerr. Management plans discussed with the patient, family and they are in agreement.  CODE STATUS: DNR TOTAL TIME TAKING CARE OF THIS PATIENT: 39 minutes.      Demetrios Loll M.D on 01/24/2015 at 3:02 PM  Between 7am to 6pm - Pager - [phone]  After 6pm go to [email or web address] - password [email or web address] [facility]  [facility]  Office  [phone]  CC: Primary care physician; No primary care provider on file.

## 2015-01-25 LAB — CREATININE, SERUM
Creatinine, Ser: 0.9 mg/dL (ref 0.61–1.24)
GFR calc Af Amer: >60 mL/min (ref >60)
GFR calc non Af Amer: >60 mL/min (ref >60)

## 2015-01-25 MED ORDER — HALOPERIDOL 1 MG PO TABS: 1.0000 mg | ORAL_TABLET | Freq: Four times a day (QID) | ORAL | Status: AC

## 2015-01-25 MED ORDER — QUETIAPINE FUMARATE 200 MG PO TABS
200.0000 mg | ORAL_TABLET | Freq: Every day | ORAL | Status: DC
  Administered 2015-01-25 – 2015-01-26 (×2): 200 mg via ORAL
  Filled 2015-01-25 (×2): qty 1

## 2015-01-25 MED ORDER — CLONAZEPAM 0.5 MG PO TABS: 0.5000 mg | ORAL_TABLET | Freq: Every day | ORAL | Status: AC

## 2015-01-25 MED ORDER — RISPERIDONE 0.5 MG PO TBDP: 0.5000 mg | ORAL_TABLET | Freq: Three times a day (TID) | ORAL | Status: AC

## 2015-01-25 MED ORDER — ALPRAZOLAM 0.25 MG PO TABS: 0.2500 mg | ORAL_TABLET | ORAL | Status: AC | PRN

## 2015-01-25 NOTE — Clinical Social Work Note (Signed)
LATE ENTRY  CSW left a message with APS worker requesting a return phone call to address discharge plan. CSW submitted his PASARR, however it is under manual review as pt's previous PASARR is expired. CSW will continue to follow.   Darden Dates, MSW, LCSW Clinical Social Worker [phone]

## 2015-01-25 NOTE — Discharge Instructions (Signed)
Regular diet, activity as tolerated. Hospice care.

## 2015-01-25 NOTE — Plan of Care (Signed)
Problem: Discharge Progression Outcomes Goal: Discharge plan in place and appropriate Individualization: Outcome: Progressing Lives at home with friend who helps care for him. Reported unhealthy and unsafe living conditions. Has been estranged from wife and family for approximately 8 years. High fall Risk, bed alarm activated. Goal: Other Discharge Outcomes/Goals Outcome: Progressing Plan of care progress to goals:  Pain: No c/o pain this shift. Hemo: WBC improving. VSS, No significant changes. O2 continued. Complications: Scheduled haldol given. Sitter discontinued. Diet:Patient tolerating diet, no complaints of nausea. Activity: Bed rest today with pt too week to get OOB.

## 2015-01-25 NOTE — Discharge Summary (Addendum)
[place] at [place] NAME: Vincent Andrade    MR#:  [id number]  DATE OF BIRTH:  04-24-1936  DATE OF ADMISSION:  01/14/2015 ADMITTING PHYSICIAN: Lance Coon, MD  DATE OF DISCHARGE:  01/26/2015 PRIMARY CARE PHYSICIAN: No primary care provider on file.    ADMISSION DIAGNOSIS:  Loss of appetite [R63.0] Chest mass [R22.2] Abdominal pain [R10.9] Chest pain [R07.9]   DISCHARGE DIAGNOSIS:   Lung cancer with meta to chest walls, Seizure,Failure to thrive in adult , Bipolar-Depression , COPD, Anemia of chronic disease.  SECONDARY DIAGNOSIS:   Past Medical History  Diagnosis Date  . Asthma   . Bipolar disorder   . Depression   . Dementia   . HLD (hyperlipidemia)   . IBS (irritable bowel syndrome)   . Schizophrenia   . COPD (chronic obstructive pulmonary disease)   . Panic attacks   . Lung cancer 01/14/2015    HOSPITAL COURSE:  Vincent Andrade is a 79 y.o. male who presents with failure to thrive. The patient was not completely oriented on interview with this Probation officer. He was on insistent on wanting to leave. Reportedly he was brought by EMS, unclear who called them, and IVC as he was a self safety risk concern. EMS report stated that he has not been taking his medications, has not been bathing, has not been eating well. Evaluation in the ED included CT chest which showed an apical mass with erosion in the chest wall consistent with bronchogenic carcinoma. Hospitalist were then called for admission for workup of the mass which is likely cancer. Patient stated that he has had some intermittent fevers lately. He also has a chronic cough, but denies hemoptysis.   * Lung cancer with meta to chest walls. The CAT scan of the chest that showed right-sided lung cancer with the metastasis to chest wall.  Palliative care staff d/w pt's FM, and changed to DNR status.  Adult protective services got involved and patient's family end up in  court 18th of May for guardianship issues. Current guardian is Miss Trey Sailors from [place] telephone number area code [phone], extension [phone]. The patient has very poor prognosis. He will be discharged to skilled nursing facility with hospice care. * Seizures of unclear etiology.the patient had a 1 episode of seizure activity in the hospital, but has neg CT of head, the patient was treated with Keppra. The patient has no more seizure activity.  *Failure to thrive in adult -malnutrition and poor oral intake. The patient is on regular diet,nutritional supplements. Patient is very deconditioned continuously has poor by mouth intake. Chronically ill looking. Getting PT and discharge planning is to skilled nursing facility with hospice.   * Bipolar-Depression - on IVC. likely a delirium. per Dr. Weber Cooks, continue current treatment. * COPD;stable.  * Anemia of chronic disease: Stable  DISCHARGE CONDITIONS:   Poor prognosis.  CONSULTS OBTAINED:  Treatment Team:  Gonzella Lex, MD Lloyd Huger, MD Theodoro Grist, MD Max Sane, MD  DRUG ALLERGIES:  No Known Allergies  DISCHARGE MEDICATIONS:   Current Discharge Medication List    START taking these medications   Details  ALPRAZolam (XANAX) 0.25 MG tablet Take 1 tablet (0.25 mg total) by mouth every 4 (four) hours as needed for anxiety. Qty: 30 tablet, Refills: 0    haloperidol (HALDOL) 1 MG tablet Take 1 tablet (1 mg total) by mouth every 6 (six) hours. Qty: 30 tablet, Refills: 0    risperiDONE (RISPERDAL M-TABS)  0.5 MG disintegrating tablet Take 1 tablet (0.5 mg total) by mouth 3 (three) times daily before meals. Qty: 30 tablet, Refills: 0      CONTINUE these medications which have CHANGED   Details  clonazePAM (KLONOPIN) 0.5 MG tablet Take 1 tablet (0.5 mg total) by mouth daily. Qty: 30 tablet, Refills: 0      CONTINUE these medications which have NOT CHANGED   Details  brimonidine (ALPHAGAN P) 0.1 % SOLN  Apply 1 drop to eye every 8 (eight) hours.    buPROPion (WELLBUTRIN XL) 150 MG 24 hr tablet Take 150 mg by mouth daily.    Fluticasone-Salmeterol (ADVAIR) 250-50 MCG/DOSE AEPB Inhale 1 puff into the lungs 2 (two) times daily.    levothyroxine (SYNTHROID, LEVOTHROID) 25 MCG tablet Take 25 mcg by mouth daily before breakfast.    PARoxetine (PAXIL) 40 MG tablet Take 40 mg by mouth daily.    QUEtiapine (SEROQUEL) 200 MG tablet Take 200 mg by mouth at bedtime.     traZODone (DESYREL) 100 MG tablet Take 100 mg by mouth at bedtime.      STOP taking these medications     simvastatin (ZOCOR) 20 MG tablet          DISCHARGE INSTRUCTIONS:   Regular diet, activity as tolerated. Hospice care.  If you experience worsening of your admission symptoms, develop shortness of breath, life threatening emergency, suicidal or homicidal thoughts you must seek medical attention immediately by calling 911 or calling your MD immediately  if symptoms less severe.  You Must read complete instructions/literature along with all the possible adverse reactions/side effects for all the Medicines you take and that have been prescribed to you. Take any new Medicines after you have completely understood and accept all the possible adverse reactions/side effects.   Please note  You were cared for by a hospitalist during your hospital stay. If you have any questions about your discharge medications or the care you received while you were in the hospital after you are discharged, you can call the unit and asked to speak with the hospitalist on call if the hospitalist that took care of you is not available. Once you are discharged, your primary care physician will handle any further medical issues. Please note that NO REFILLS for any discharge medications will be authorized once you are discharged, as it is imperative that you return to your primary care physician (or establish a relationship with a primary care physician  if you do not have one) for your aftercare needs so that they can reassess your need for medications and monitor your lab values.    Today   SUBJECTIVE   The patient is demented and quite.   VITAL SIGNS:  Blood pressure 110/62, pulse 81, temperature 97.5 F (36.4 C), temperature source Oral, resp. rate 18, height '6\' 1"'$  (1.854 m), weight 59.421 kg (131 lb), SpO2 97 %.  I/O:   Intake/Output Summary (Last 24 hours) at 01/25/15 1112 Last data filed at 01/25/15 7829  Gross per 24 hour  Intake   1386 ml  Output   1100 ml  Net    286 ml    PHYSICAL EXAMINATION:  GENERAL:  79 y.o.-year-old patient lying in the bed with no acute distress. Chronic ill-looking. EYES: No scleral icterus. Extraocular muscles intact.  HEENT: Head atraumatic, normocephalic. Oropharynx and nasopharynx clear.  NECK:  Supple, no jugular venous distention. No thyroid enlargement, no tenderness.  LUNGS: Normal breath sounds bilaterally, no wheezing, rales,rhonchi or crepitation.  No use of accessory muscles of respiration.  CARDIOVASCULAR: S1, S2 normal. No murmurs, rubs, or gallops.  ABDOMEN: Soft, non-tender, non-distended. Bowel sounds present. No organomegaly or mass.  EXTREMITIES: No pedal edema, cyanosis, or clubbing.  NEUROLOGIC: The patient is a demented. Unable to exam. PSYCHIATRIC: Demented. SKIN: No obvious rash, lesion, or ulcer.   DATA REVIEW:   CBC  Recent Labs Lab 01/24/15 0438  WBC 8.6  HGB 8.3*  HCT 26.3*  PLT 321    Chemistries   Recent Labs Lab 01/24/15 0438 01/25/15 0546  NA 139  --   K 4.2  --   CL 106  --   CO2 28  --   GLUCOSE 89  --   BUN 12  --   CREATININE 1.01 0.90  CALCIUM 8.2*  --   MG 1.9  --     Cardiac Enzymes No results for input(s): TROPONINI in the last 168 hours.  Microbiology Results  No results found for this or any previous visit.  RADIOLOGY:  No results found.      Management and discharge plans discussed with the patient's wife and  daughter, social worker, palliative care staff and they are in agreement.  CODE STATUS:     Code Status Orders        Start     Ordered   01/15/15 1436  Do not attempt resuscitation (DNR)   Continuous    Question Answer Comment  In the event of cardiac or respiratory ARREST Do not call a "code blue"   In the event of cardiac or respiratory ARREST Do not perform Intubation, CPR, defibrillation or ACLS   In the event of cardiac or respiratory ARREST Use medication by any route, position, wound care, and other measures to relive pain and suffering. May use oxygen, suction and manual treatment of airway obstruction as needed for comfort.      01/15/15 1435    Advance Directive Documentation        Most Recent Value   Type of Advance Directive  Healthcare Power of Attorney, Living will   Pre-existing out of facility DNR order (yellow form or pink MOST form)     "MOST" Form in Place?        TOTAL TIME TAKING CARE OF THIS PATIENT: 57 minutes.    Between 7am to 6pm - Pager - [phone]  After 6pm go to [email or web address] - password [email or web address] [facility]  [place] [facility]  Office  [phone]  CC: Primary care physician; No primary care provider on file.

## 2015-01-25 NOTE — Care Management Note (Signed)
Case Management Note  Patient Details  Name: Vincent Andrade MRN: [id number] Date of Birth: September 12, 1935  Subjective/Objective:            Spoke with [name] legal guardian, Ms Tania Ade [phone] ext [phone], at [place], and she chose [facility] provide [facility] services at [facility] for [name]. Updated Flo Shanks at [facility] of A/[facility].         Action/Plan:   Expected Discharge Date:                  Expected Discharge Plan:     In-House Referral:     Discharge planning Services     Post Acute Care Choice:    Choice offered to:     DME Arranged:    DME Agency:     HH Arranged:    [place] Agency:     Status of Service:     Medicare Important Message Given:  Yes Date Medicare IM Given:  01/15/15 Medicare IM give by:  Shelbie Ammons RN MSN Date Additional Medicare IM Given:  01/21/15 Additional Medicare Important Message give by:  Marshell Garfinkel  If discussed at [employer] of Stay Meetings, dates discussed:    Additional Comments:  Staysha Truby A, RN 01/25/2015, 10:32 AM

## 2015-01-25 NOTE — Progress Notes (Signed)
[place] at [place] NAME: Vincent Andrade    MR#:  [id number]  DATE OF BIRTH:  Jan 11, 1936  SUBJECTIVE;  seen today has some cough. Po intake continues to be poor. I spoke with Dr. Grayland Ormond who is going to call his daughter Vincent Andrade.   CHIEF COMPLAINT:   Chief Complaint  Patient presents with  . Failure To Thrive   Confused and quiet.  DRUG ALLERGIES:  No Known Allergies  VITALS:  Blood pressure 96/61, pulse 79, temperature 98.2 F (36.8 C), temperature source Oral, resp. rate 18, height '6\' 1"'$  (1.854 m), weight 59.421 kg (131 lb), SpO2 99 %.  PHYSICAL EXAMINATION:  GENERAL:  79 y.o.-year-old patient lying in the bed with no acute distress. Thin. Chronically ill-looking. EYES:  No scleral icterus. Extraocular muscles intact.  HEENT: Head atraumatic, normocephalic. The patient did not follow commands, unable to exam.  NECK:  Supple, no jugular venous distention. No thyroid enlargement, no tenderness.  LUNGS: Some diminished breath sounds bilaterally, but poor effort, few wheezes noted, no rales,rhonchi or crepitation. No use of accessory muscles of respiration.  CARDIOVASCULAR: S1, S2 normal. No murmurs, rubs, or gallops.  ABDOMEN: Soft, nontender, nondistended. Bowel sounds present. No organomegaly or mass.  EXTREMITIES: No pedal edema, cyanosis, or clubbing.  NEUROLOGIC: The patient didn't follow commands.unable to exam.  PSYCHIATRIC: The patient is confused.  SKIN: No obvious rash, lesion, or ulcer.    LABORATORY PANEL:   CBC  Recent Labs Lab 01/24/15 0438  WBC 8.6  HGB 8.3*  HCT 26.3*  PLT 321   ------------------------------------------------------------------------------------------------------------------  Chemistries   Recent Labs Lab 01/24/15 0438 01/25/15 0546  NA 139  --   K 4.2  --   CL 106  --   CO2 28  --   GLUCOSE 89  --   BUN 12  --   CREATININE 1.01 0.90  CALCIUM 8.2*  --   MG 1.9  --     ------------------------------------------------------------------------------------------------------------------  Cardiac Enzymes No results for input(s): TROPONINI in the last 168 hours. ------------------------------------------------------------------------------------------------------------------  RADIOLOGY:  No results found.  EKG:   Orders placed or performed during the hospital encounter of 01/14/15  . ED EKG  . ED EKG  . EKG 12-Lead  . EKG 12-Lead    ASSESSMENT AND PLAN:  Admitted for  Not able to manage at home,did not change clothes and took shower for week and not eating.found to have  Large lung mass/ * Lung cancer with meta to chest walls. Palliative care staff d/w pt's FM, now DNR.  Daughter Vincent Andrade requested to speak to Dr. Grayland Ormond regarding treatment options. Adult protective services got involved and patient's family end up in court 18th  of May for guardianship issues. Current guardian is Vincent Andrade from [place] telephone number area code [phone], extension [phone].  * Seizures of unclear etiology at this time concerning for possible distant spread spread of metastasis, neg CT of head, cont Keppra. Precaution.  *Failure to thrive in adult -malnutrition;continue regular diet,nutritional supplements. Patient is very deconditioned continuously has poor by mouth intake. Chronically ill looking. Getting PT and discharge planning is to skilled nursing facility with hospice.   * Bipolar-Depression - on IVC.  likely a delirium. per Dr. Weber Cooks, continue current treatment.  * COPD;stable.  * Anemia of chronic disease: Stable * Dehydration improved.   CODE STATUS DO NOT RESUSCITATE The patient could not be discharged, since the patient is on sitter, and the need the  off sitter for 24 hours per Education officer, museum. The patient will be possibly discharged to skilled nursing facility with hospice care tomorrow.   All the records are reviewed and case discussed with Care  Management/Social Workerr. Management plans discussed with the patient, the patient's wife and daughter, and they are in agreement.  CODE STATUS: DNR TOTAL TIME TAKING CARE OF THIS PATIENT: 35  minutes.      Demetrios Loll M.D on 01/25/2015 at 4:37 PM  Between 7am to 6pm - Pager - [phone]  After 6pm go to [email or web address] - password [email or web address] [facility]  [place] [facility]  Office  [phone]  CC: Primary care physician; No primary care provider on file.

## 2015-01-25 NOTE — Clinical Social Work Note (Signed)
Pt's PASARR is currently under review. Pt is no longer under IVC. Pt's sitter has been discontinued. CSW left a message with DSS worker and requested a return phone call to address discharge plan. CSW updated MD and Charge RN on status of disposition and barriers to discharge. CSW will continue to follow.   Darden Dates, MSW, LCSW Clinical Social Worker  [phone]

## 2015-01-25 NOTE — Consult Note (Signed)
  Psychiatry: Follow-up for this patient with a history of bipolar disorder also with delirium and advanced lung cancer as well as multiple other problems. Patient seen chart reviewed. Patient is awake when I came into the room. He was oriented to being in the hospital. He was able to maintain some eye contact and have a brief conversation. Speech is quiet affect flat. Patient denies any pain denies nausea and does not have any specific complaints. Did not do any formal cognitive testing. He says that he is trying to hang in there emotionally. Denies suicidal ideation.  Possibly less delirious possibly related to stopping the lithium. Continue off the lithium. I'm also going to decrease his Seroquel down to 200 mg at night see if that helps. Maybe he will do fine with the lower dose as long as he doesn't get more agitated. I believe patient is still awaiting placement. Discussed plan with patient. No other change of plans for now follow-up as needed

## 2015-01-25 NOTE — Plan of Care (Signed)
Problem: Discharge Progression Outcomes Goal: Discharge plan in place and appropriate Individualization: Individualization:   Lives at home with friend who helps care for him. Reported unhealthy and unsafe living conditions. Has been estranged from wife and family for approximately 8 years. Currently IVC for safety with 1:1 sitter. High fall Risk, bed alarm activated. Goal: Other Discharge Outcomes/Goals Outcome: Progressing Plan of care progress to goals: Pain: No c/o pain this shift. Hemo: WBC improving. VSS, No significant changes. O2 continued. Complications: Agitation/anxiety required extra doses of haldol prn, with improvement noted; IVC continued with 1:1 sitter.  Diet: Improvement this shift, friend/family in and fed patient 1 cup ice cream and 1 croissant Activity: Bed rest today with pt too week to get OOB.

## 2015-01-25 NOTE — Care Management Note (Signed)
Case Management Note  Patient Details  Name: Vincent Andrade MRN: [id number] Date of Birth: Jan 11, 1936  Subjective/Objective:                    Action/Plan:   Expected Discharge Date:                  Expected Discharge Plan:     In-House Referral:     Discharge planning Services     Post Acute Care Choice:    Choice offered to:     DME Arranged:    DME Agency:     HH Arranged:    [place]:     Status of Service:     Medicare Important Message Given:  Yes Date Medicare IM Given:  01/15/15 Medicare IM give by:  Shelbie Ammons RN MSN Date Additional Medicare IM Given:  01/21/15    01/25/15 Additional Medicare Important Message give by:  Alene Mires, RN,  If discussed at [employer] of Stay Meetings, dates discussed:  01/25/15  Additional Comments:  Mardene Speak, RN 01/25/2015, 8:47 AM

## 2015-01-25 NOTE — Progress Notes (Signed)
PT Cancellation Note  Patient Details Name: Vincent Andrade MRN: [id number] DOB: 1935-12-09   Cancelled Treatment:    Reason Eval/Treat Not Completed: Medical issues which prohibited therapy;Fatigue/lethargy limiting ability to participate.  Too weak for OOB per nursing and requesting bedrest today.   Ramond Dial 01/25/2015, 10:11 AM  Mee Hives, PT MS Acute Rehab Dept. Number: [facility] [id number] and [place] [phone]

## 2015-01-26 NOTE — Clinical Social Work Note (Signed)
Pt has been sitter free for 24 hours. PASARR is still pending. CSW updated [place] as well as facility. CSW will continue to follow.   Darden Dates, MSW, LCSW Clinical Social Worker [phone]

## 2015-01-26 NOTE — Plan of Care (Signed)
Problem: Discharge Progression Outcomes Goal: Other Discharge Outcomes/Goals Outcome: Adequate for Discharge Pt more alert today and responsive according to dr Bridgett Larsson md. Pt  Able to feed self today . Able to  Perform some adls per self. Ready for discharge when passar  Number come.gets anxious at times but otherwise calm.foley remains in and patent which is chronic.

## 2015-01-26 NOTE — Consult Note (Signed)
  Psychiatry: Follow-up note for this 79 year old man with delirium related to his multiple and severe medical problems. Also history of bipolar disorder. Spoke with the nurse and reviewed the chart. Nursing reports that his behavior is been much better today. He is not getting aggressive or lashing out at all. In conversation with the patient he says he is feeling nervous and mentions once again his [place] phobia. Denies suicidal ideation.  Patient is awake and able to communicate. Not agitated. Makes good eye contact. Seems anxious but not particularly afraid. No suicidal ideation. He is open to being consoled and offered comfort.  Current low-dose of standing Risperdal as well as when necessary Haldol seems to be helping. No indication to add anymore mood stabilizers at this point. Continue the 200 mg of Seroquel at night and make sure he sleeps well. I will continue to follow-up as long as he remains in the hospital.

## 2015-01-26 NOTE — Progress Notes (Signed)
PT Cancellation Note  Patient Details Name: Vincent Andrade MRN: [id number] DOB: 05/04/1936   Cancelled Treatment:    Reason Eval/Treat Not Completed: Fatigue/lethargy limiting ability to participate;Patient declined, no reason specified.  Will check later if time allows   Ramond Dial 01/26/2015, 12:19 PM   Mee Hives, PT MS Acute Rehab Dept. Number: [facility] [id number] and [place] [phone]

## 2015-01-26 NOTE — Plan of Care (Signed)
Problem: Discharge Progression Outcomes Goal: Other Discharge Outcomes/Goals Outcome: Not Progressing Plan of care progress to goals:  Pain: No c/o pain this shift. Hemo:  VSS, No significant changes. O2 continued. Complications: Scheduled haldol given. Pt very tearful. Chaplin up to visit with pt and family. Diet:Patient tolerating diet, no complaints of nausea. Activity: Bed rest today with pt too week to get OOB.

## 2015-01-26 NOTE — Progress Notes (Signed)
Nutrition Follow-up  DOCUMENTATION CODES:  Non-severe (moderate) malnutrition in context of chronic illness  INTERVENTION: Meals and Snacks: Cater to patient preferences Medical Food Supplement Therapy: continue supplements as ordered Magic cup  NUTRITION DIAGNOSIS:  Inadequate oral intake related to acute illness as evidenced by meal completion < 25%; ongoing  GOAL:  Patient will meet greater than or equal to 90% of their needs  MONITOR:   (Energy Intake, Electrolyte and Renal Profile, Digestive system, Anthropometrics)  REASON FOR ASSESSMENT:  Consult Assessment of nutrition requirement/status  ASSESSMENT:  Pt awaiting PASAAR number for discharge per MD note; pt discharge to SNF as ordered. Pt without sitter for >24hours.  PO Intake: pt reports eating chocolate pudding today at lunch. Per RN Freddie Breech pt able to feed himself today.  Height:  Ht Readings from Last 1 Encounters:  01/14/15 '6\' 1"'$  (1.854 m)    Weight:  Wt Readings from Last 1 Encounters:  01/14/15 131 lb (59.421 kg)    Ideal Body Weight:     Wt Readings from Last 10 Encounters:  01/14/15 131 lb (59.421 kg)    BMI:  Body mass index is 17.29 kg/(m^2).  Estimated Nutritional Needs:  Kcal:  1765-2086kcals, BEE: 1338kcals, TEE: (IF 1.1-1.3)(AF 1.2)   Protein:  59-71g protein (1.0-1.2g/kg)  Fluid:  1475-1765m of fluid (25-376mkg)  Skin:     Diet Order:  DIET SOFT Room service appropriate?: Yes; Fluid consistency:: Thin Diet - low sodium heart healthy  EDUCATION NEEDS:  No education needs identified at this time   Intake/Output Summary (Last 24 hours) at 01/26/15 1500 Last data filed at 01/26/15 1345  Gross per 24 hour  Intake    970 ml  Output    400 ml  Net    570 ml    Last BM:  5/22  Will sign off secondary to discharge orders. Please reconsult RD if change in poc.  AlDwyane LuoRDNew HampshireLDN Pager [phone]

## 2015-01-26 NOTE — Progress Notes (Signed)
Chaplain met with patient, spouse, daughter and son-in-law, offered prayer and emotional support along with emphatic listening.  Loralyn Freshwater D. Alroy Dust Monday 01/25/2015 [phone]   01/25/15 2029  Clinical Encounter Type  Visited With Patient and family together  Visit Type Follow-up;Spiritual support;Code  Referral From Nurse  Consult/Referral To Chaplain  Spiritual Encounters  Spiritual Needs Prayer;Emotional  Stress Factors  Patient Stress Factors Health changes  Family Stress Factors Health changes

## 2015-01-26 NOTE — Progress Notes (Addendum)
[place] at [place] NAME: Vincent Andrade    MR#:  [id number]  DATE OF BIRTH:  April 29, 1936  SUBJECTIVE;  seen today has some cough. Po intake continues to be poor. I spoke with Dr. Grayland Ormond who is going to call his daughter Vincent Andrade.   CHIEF COMPLAINT:   Chief Complaint  Patient presents with  . Failure To Thrive   The patient is awake but demented, no complaint. DRUG ALLERGIES:  No Known Allergies  VITALS:  Blood pressure 120/70, pulse 83, temperature 98 F (36.7 C), temperature source Oral, resp. rate 18, height '6\' 1"'$  (2.694 m), weight 59.421 kg (131 lb), SpO2 98 %.  PHYSICAL EXAMINATION:  GENERAL:  79 y.o.-year-old patient lying in the bed with no acute distress. Thin. Chronically ill-looking. EYES:  No scleral icterus. Extraocular muscles intact.  HEENT: Head atraumatic, normocephalic. The patient did not follow commands, unable to exam.  NECK:  Supple, no jugular venous distention. No thyroid enlargement, no tenderness.  LUNGS: Some diminished breath sounds bilaterally, but poor effort, few wheezes noted, no rales,rhonchi or crepitation. No use of accessory muscles of respiration.  CARDIOVASCULAR: S1, S2 normal. No murmurs, rubs, or gallops.  ABDOMEN: Soft, nontender, nondistended. Bowel sounds present. No organomegaly or mass.  EXTREMITIES: No pedal edema, cyanosis, or clubbing.  NEUROLOGIC: The patient didn't follow commands.unable to exam.  PSYCHIATRIC: The patient is demented.  SKIN: No obvious rash, lesion, or ulcer.    LABORATORY PANEL:   CBC  Recent Labs Lab 01/24/15 0438  WBC 8.6  HGB 8.3*  HCT 26.3*  PLT 321   ------------------------------------------------------------------------------------------------------------------  Chemistries   Recent Labs Lab 01/24/15 0438 01/25/15 0546  NA 139  --   K 4.2  --   CL 106  --   CO2 28  --   GLUCOSE 89  --   BUN 12  --   CREATININE 1.01 0.90  CALCIUM  8.2*  --   MG 1.9  --    ------------------------------------------------------------------------------------------------------------------  Cardiac Enzymes No results for input(s): TROPONINI in the last 168 hours. ------------------------------------------------------------------------------------------------------------------  RADIOLOGY:  No results found.  EKG:   Orders placed or performed during the hospital encounter of 01/14/15  . ED EKG  . ED EKG  . EKG 12-Lead  . EKG 12-Lead    ASSESSMENT AND PLAN:  Admitted for  Not able to manage at home,did not change clothes and took shower for week and not eating.found to have  Large lung mass/ * Lung cancer with meta to chest walls. Palliative care staff d/w pt's FM, now DNR.  Daughter Vincent Andrade requested to speak to Dr. Grayland Ormond regarding treatment options. Adult protective services got involved and patient's family end up in court 18th  of May for guardianship issues. Current guardian is Miss Vincent Andrade from [place] telephone number area code [phone], extension [phone].  * Seizures of unclear etiology at this time concerning for possible distant spread spread of metastasis, neg CT of head, cont Keppra. Precaution.  *Failure to thrive in adult -malnutrition;continue regular diet,nutritional supplements. Patient is very deconditioned continuously has poor by mouth intake. Chronically ill looking. Getting PT and discharge planning is to skilled nursing facility with hospice.   * Bipolar-Depression -  likely a delirium, which improved, off IVC for 24 hours. per Dr. Weber Cooks, continue current treatment.  * COPD;stable.  * Anemia of chronic disease: Stable * Dehydration improved.   CODE STATUS DO NOT RESUSCITATE Still waiting for PASAR per social worker.  The patient will be possibly discharged to skilled nursing facility with hospice care tomorrow.   All the records are reviewed and case discussed with Care Management/Social  Workerr. Management plans discussed with the patient, nurse and case manager and Education officer, museum.  CODE STATUS: DNR TOTAL TIME TAKING CARE OF THIS PATIENT: 35  minutes.      Demetrios Loll M.D on 01/26/2015 at 4:49 PM  Between 7am to 6pm - Pager - [phone]  After 6pm go to [email or web address] - password [email or web address] [facility]  [place] [facility]  Office  [phone]  CC: Primary care physician; No primary care provider on file.

## 2015-01-26 NOTE — Progress Notes (Signed)
[place] notified [facility] for visit by priest as PT requested.   [place] [phone]   01/26/15 0900  Clinical Encounter Type  Visited With Patient  Visit Type Spiritual support;Follow-up  Referral From [place] Other (Comment) (PT requested visit from [place] priest.)  Stress Factors  Patient Stress Factors Health changes  Family Stress Factors Health changes

## 2015-01-26 NOTE — Progress Notes (Addendum)
Notified by CSW Judson Roch of new referral for [facility] after discharge. Plan is for patient to discharge to [place] with hospice to follow. Patient currently has an APS guardian. Writer has not engaged with patient, per CSW patient is currently awaiting a  PASSAR number prior to placement, he continues to have a 1:1 sitter present in his room. CSW to advise when patient is ready for discharge. Information faxed to referral intake. Thank you Flo Shanks RN, BSN, [place] [facility] [place], [facility] [phone]

## 2015-01-27 NOTE — Plan of Care (Signed)
Problem: Discharge Progression Outcomes Goal: Other Discharge Outcomes/Goals Outcome: Adequate for Discharge Pt more confused  Today than yesterday. Picking at  [facility] and gown, alert . o2 cont at 2 l .  Only tol very small amt of diet if at all.

## 2015-01-27 NOTE — Progress Notes (Signed)
Follow up visit made on new referral for [facility] at [facility] care after discharge. Patient seen lying in bed, alert to person. Per CSW and attending physician Dr. Darvin Neighbours patient to be discharged to [facility] today via EMS. Discharge summary faxed to hospice eferral intake. Hospice to obtain order for hospice from attending physician at [facility]. Per CSW Sarah patient does have a DSS guardian, Tania Ade [phone]) she is aware of Hospice referral,  contact information provided to Hospice referral intake. Thank you. Flo Shanks RN, BSN, [place] [facility], [facility] [phone] c

## 2015-01-27 NOTE — Progress Notes (Signed)
Pt for discharge to [facility] via ems. No resp distress at rest . Some dyspnea with increased exertion. 02 2 l [place].  Color pale. Anxiety. meds given and  Pt had  Improvement of anxiousness. Sl d/cd. Report to [facility] nurse. Ems called and pt ready for transport.

## 2015-01-27 NOTE — Discharge Summary (Signed)
[place] at [place] NAME: Vincent Andrade    MR#:  [id number]  DATE OF BIRTH:  Dec 21, 1935  DATE OF ADMISSION:  01/14/2015 ADMITTING PHYSICIAN: Lance Coon, MD  DATE OF DISCHARGE:  01/26/2015 PRIMARY CARE PHYSICIAN: [facility]   ADMISSION DIAGNOSIS:  Loss of appetite [R63.0] Chest mass [R22.2] Abdominal pain [R10.9] Chest pain [R07.9]   DISCHARGE DIAGNOSIS:   Lung cancer with meta to chest walls, Seizure,Failure to thrive in adult , Bipolar-Depression , COPD, Anemia of chronic disease.  SECONDARY DIAGNOSIS:   Past Medical History  Diagnosis Date  . Asthma   . Bipolar disorder   . Depression   . Dementia   . HLD (hyperlipidemia)   . IBS (irritable bowel syndrome)   . Schizophrenia   . COPD (chronic obstructive pulmonary disease)   . Panic attacks   . Lung cancer 01/14/2015    HOSPITAL COURSE:  Vincent Andrade is a 79 y.o. male who presents with failure to thrive. The patient was not completely oriented on interview with this Probation officer. He was on insistent on wanting to leave. Reportedly he was brought by EMS, unclear who called them, and IVC as he was a self safety risk concern. EMS report stated that he has not been taking his medications, has not been bathing, has not been eating well. Evaluation in the ED included CT chest which showed an apical mass with erosion in the chest wall consistent with bronchogenic carcinoma. Hospitalist were then called for admission for workup of the mass which is likely cancer. Patient stated that he has had some intermittent fevers lately. He also has a chronic cough, but denies hemoptysis.   * Lung cancer with metastatsis to chest wall. The CAT scan of the chest that showed right-sided lung cancer with the metastasis to chest wall. Palliative care staff d/w pt's family, and code status changed to DNR status.  Adult protective services got involved and patient's family end up  in court 18th of May for guardianship issues. Current guardian is Miss Trey Sailors from [place] telephone number area code [phone], extension [phone].  The patient has very poor prognosis. He will be discharged to skilled nursing facility with hospice care. High risk for readmission. It would be better to transition patient to comfort measures if he worsens with his respiratory satatus.  * Seizures of unclear etiology.the patient had a 1 episode of seizure activity in the hospital, but has neg CT of head, the patient was treated with Keppra. The patient has no more seizure activity.   *Failure to thrive in adult -malnutrition and poor oral intake. The patient is on regular diet,nutritional supplements. Patient is very deconditioned continuously has poor by mouth intake. Chronically ill looking. Getting PT and discharge planning is to skilled nursing facility with hospice.    * Bipolar-Depression - on IVC. likely a delirium. per Dr. Weber Cooks, continue current treatment.  * COPD;stable. He does has chronic weezing.  * Anemia of chronic disease: Stable  DISCHARGE CONDITIONS:   Poor prognosis.  CONSULTS OBTAINED:  Treatment Team:  Gonzella Lex, MD Lloyd Huger, MD Theodoro Grist, MD Max Sane, MD  DRUG ALLERGIES:  No Known Allergies  DISCHARGE MEDICATIONS:   Current Discharge Medication List    START taking these medications   Details  ALPRAZolam (XANAX) 0.25 MG tablet Take 1 tablet (0.25 mg total) by mouth every 4 (four) hours as needed for anxiety. Qty: 30 tablet, Refills: 0    haloperidol (  HALDOL) 1 MG tablet Take 1 tablet (1 mg total) by mouth every 6 (six) hours. Qty: 30 tablet, Refills: 0    risperiDONE (RISPERDAL M-TABS) 0.5 MG disintegrating tablet Take 1 tablet (0.5 mg total) by mouth 3 (three) times daily before meals. Qty: 30 tablet, Refills: 0      CONTINUE these medications which have CHANGED   Details  clonazePAM (KLONOPIN) 0.5 MG tablet Take 1  tablet (0.5 mg total) by mouth daily. Qty: 30 tablet, Refills: 0      CONTINUE these medications which have NOT CHANGED   Details  brimonidine (ALPHAGAN P) 0.1 % SOLN Apply 1 drop to eye every 8 (eight) hours.    buPROPion (WELLBUTRIN XL) 150 MG 24 hr tablet Take 150 mg by mouth daily.    Fluticasone-Salmeterol (ADVAIR) 250-50 MCG/DOSE AEPB Inhale 1 puff into the lungs 2 (two) times daily.    levothyroxine (SYNTHROID, LEVOTHROID) 25 MCG tablet Take 25 mcg by mouth daily before breakfast.    PARoxetine (PAXIL) 40 MG tablet Take 40 mg by mouth daily.    QUEtiapine (SEROQUEL) 200 MG tablet Take 200 mg by mouth at bedtime.     traZODone (DESYREL) 100 MG tablet Take 100 mg by mouth at bedtime.      STOP taking these medications     simvastatin (ZOCOR) 20 MG tablet          DISCHARGE INSTRUCTIONS:   Regular diet, activity as tolerated with assistance.  High risk for falls Hospice care.  If you experience worsening of your admission symptoms, develop shortness of breath, life threatening emergency, suicidal or homicidal thoughts you must seek medical attention immediately by calling 911 or calling your MD immediately  if symptoms less severe.  You Must read complete instructions/literature along with all the possible adverse reactions/side effects for all the Medicines you take and that have been prescribed to you. Take any new Medicines after you have completely understood and accept all the possible adverse reactions/side effects.   Please note  You were cared for by a hospitalist during your hospital stay. If you have any questions about your discharge medications or the care you received while you were in the hospital after you are discharged, you can call the unit and asked to speak with the hospitalist on call if the hospitalist that took care of you is not available. Once you are discharged, your primary care physician will handle any further medical issues. Please note that  NO REFILLS for any discharge medications will be authorized once you are discharged, as it is imperative that you return to your primary care physician (or establish a relationship with a primary care physician if you do not have one) for your aftercare needs so that they can reassess your need for medications and monitor your lab values.    Today   SUBJECTIVE   The patient is demented and quite.   VITAL SIGNS:  Blood pressure 139/75, pulse 89, temperature 98.1 F (36.7 C), temperature source Oral, resp. rate 18, height '6\' 1"'$  (1.854 m), weight 59.421 kg (131 lb), SpO2 98 %.  I/O:    Intake/Output Summary (Last 24 hours) at 01/27/15 1104 Last data filed at 01/27/15 1035  Gross per 24 hour  Intake   1985 ml  Output   1400 ml  Net    585 ml    PHYSICAL EXAMINATION:  GENERAL:  79 y.o.-year-old patient lying in the bed with no acute distress. Chronic ill-looking. EYES: No scleral icterus. Extraocular  muscles intact.  HEENT: Head atraumatic, normocephalic. Oropharynx and nasopharynx clear.  LUNGS: Normal work of breathing. Weezing bilateral. CARDIOVASCULAR: S1, S2 normal. No murmurs, rubs, or gallops.  ABDOMEN: Soft, non-tender, non-distended. Bowel sounds present. No organomegaly or mass.  EXTREMITIES: No pedal edema, cyanosis, or clubbing.  NEUROLOGIC: The patient is a demented. Unable to exam. PSYCHIATRIC: Demented.  DATA REVIEW:   CBC  Recent Labs Lab 01/24/15 0438  WBC 8.6  HGB 8.3*  HCT 26.3*  PLT 321    Chemistries   Recent Labs Lab 01/24/15 0438 01/25/15 0546  NA 139  --   K 4.2  --   CL 106  --   CO2 28  --   GLUCOSE 89  --   BUN 12  --   CREATININE 1.01 0.90  CALCIUM 8.2*  --   MG 1.9  --     Cardiac Enzymes No results for input(s): TROPONINI in the last 168 hours.  Microbiology Results  No results found for this or any previous visit.  RADIOLOGY:  No results found.      Management and discharge plans discussed with the patient's  wife and daughter, social worker, palliative care staff and they are in agreement.  CODE STATUS:     Code Status Orders        Start     Ordered   01/15/15 1436  Do not attempt resuscitation (DNR)   Continuous    Question Answer Comment  In the event of cardiac or respiratory ARREST Do not call a "code blue"   In the event of cardiac or respiratory ARREST Do not perform Intubation, CPR, defibrillation or ACLS   In the event of cardiac or respiratory ARREST Use medication by any route, position, wound care, and other measures to relive pain and suffering. May use oxygen, suction and manual treatment of airway obstruction as needed for comfort.      01/15/15 1435    Advance Directive Documentation        Most Recent Value   Type of Advance Directive  Healthcare Power of Attorney, Living will   Pre-existing out of facility DNR order (yellow form or pink MOST form)     "MOST" Form in Place?        TOTAL DISCHARGE TIME SPENT TODAY: 35 minutes.    Between 7am to 6pm - Pager - [phone]  After 6pm go to [email or web address] - password [email or web address] [facility]  [place] [facility]  Office  [phone]  CC: Primary care physician; [place] [facility]

## 2015-01-27 NOTE — Progress Notes (Signed)
Ems here to transport pt to [facility].  No c/o.

## 2015-01-27 NOTE — Plan of Care (Signed)
Problem: Discharge Progression Outcomes Goal: Other Discharge Outcomes/Goals Plan of Care Progress to Goals:  Pain:  Pt remains free of pain. Hemodynamically: WBC's WNL, VSS, O2 sat's in 90's on 2L O2.  Anxiety:  Pt continues to get very anxious at times. PRN and scheduled meds offer relief  Pt for possible discharge tonight

## 2015-01-27 NOTE — Clinical Social Work Note (Signed)
Pt is ready for discharge today to [facility]. Facility has received discharge information and is ready to admit pt. CSW updated DSS worker, who will follow up with facility regarding paperwork. RN will call report and EMS will provide transportation. CSW signing off as no further needs identified.   Darden Dates, MSW, LCSW Clinical Social Worker  [phone]

## 2015-01-27 NOTE — Progress Notes (Signed)
MD notified of pt being more SOB and lungs sounding wet. Per MD discontinue fluids

## 2015-01-27 NOTE — Clinical Social Work Note (Signed)
Pt is ready for discharge today.  The PASARR has been received.  CSW left a message for DSS guardian and informed staff at [facility]. CSW will be transported via EMS.   [place], [place] Naida Sleight, [place] 01/27/2015, 10:57 AM

## 2015-03-05 DEATH — deceased
